# Patient Record
Sex: Male | Born: 1955 | Race: Black or African American | Hispanic: No | Marital: Married | Smoking: Current every day smoker
Health system: Southern US, Community
[De-identification: ages and names within clinical notes are randomized; demographics above are authoritative.]

## PROBLEM LIST (undated history)

## (undated) DIAGNOSIS — K649 Unspecified hemorrhoids: Secondary | ICD-10-CM

## (undated) DIAGNOSIS — I10 Essential (primary) hypertension: Secondary | ICD-10-CM

## (undated) HISTORY — PX: PANCREATECTOMY: SHX1019

## (undated) HISTORY — PX: KIDNEY DONATION: SHX685

---

## 2000-12-04 ENCOUNTER — Emergency Department (HOSPITAL_COMMUNITY): Admission: EM | Admit: 2000-12-04 | Discharge: 2000-12-05 | Payer: Self-pay | Admitting: Emergency Medicine

## 2001-05-07 ENCOUNTER — Observation Stay (HOSPITAL_COMMUNITY): Admission: EM | Admit: 2001-05-07 | Discharge: 2001-05-08 | Payer: Self-pay | Admitting: Emergency Medicine

## 2001-05-07 ENCOUNTER — Encounter: Payer: Self-pay | Admitting: Emergency Medicine

## 2001-05-07 ENCOUNTER — Encounter: Payer: Self-pay | Admitting: Family Medicine

## 2002-03-16 ENCOUNTER — Emergency Department (HOSPITAL_COMMUNITY): Admission: EM | Admit: 2002-03-16 | Discharge: 2002-03-16 | Payer: Self-pay

## 2002-05-10 ENCOUNTER — Emergency Department (HOSPITAL_COMMUNITY): Admission: EM | Admit: 2002-05-10 | Discharge: 2002-05-10 | Payer: Self-pay | Admitting: Emergency Medicine

## 2002-05-23 ENCOUNTER — Emergency Department (HOSPITAL_COMMUNITY): Admission: EM | Admit: 2002-05-23 | Discharge: 2002-05-24 | Payer: Self-pay | Admitting: Emergency Medicine

## 2002-05-23 ENCOUNTER — Encounter: Payer: Self-pay | Admitting: Emergency Medicine

## 2003-03-03 ENCOUNTER — Emergency Department (HOSPITAL_COMMUNITY): Admission: EM | Admit: 2003-03-03 | Discharge: 2003-03-04 | Payer: Self-pay | Admitting: Emergency Medicine

## 2004-06-16 ENCOUNTER — Emergency Department (HOSPITAL_COMMUNITY): Admission: EM | Admit: 2004-06-16 | Discharge: 2004-06-16 | Payer: Self-pay | Admitting: *Deleted

## 2005-03-20 ENCOUNTER — Emergency Department (HOSPITAL_COMMUNITY): Admission: EM | Admit: 2005-03-20 | Discharge: 2005-03-20 | Payer: Self-pay | Admitting: Emergency Medicine

## 2006-11-01 ENCOUNTER — Emergency Department (HOSPITAL_COMMUNITY): Admission: EM | Admit: 2006-11-01 | Discharge: 2006-11-01 | Payer: Self-pay | Admitting: Emergency Medicine

## 2008-03-08 ENCOUNTER — Emergency Department (HOSPITAL_COMMUNITY): Admission: EM | Admit: 2008-03-08 | Discharge: 2008-03-08 | Payer: Self-pay | Admitting: Emergency Medicine

## 2010-07-06 ENCOUNTER — Emergency Department (HOSPITAL_BASED_OUTPATIENT_CLINIC_OR_DEPARTMENT_OTHER): Admission: EM | Admit: 2010-07-06 | Discharge: 2010-07-06 | Payer: Self-pay | Admitting: Emergency Medicine

## 2010-07-06 ENCOUNTER — Ambulatory Visit: Payer: Self-pay | Admitting: Diagnostic Radiology

## 2010-11-03 ENCOUNTER — Emergency Department (HOSPITAL_COMMUNITY)
Admission: EM | Admit: 2010-11-03 | Discharge: 2010-11-03 | Payer: Self-pay | Source: Home / Self Care | Admitting: Emergency Medicine
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
## 2011-07-24 LAB — COMPREHENSIVE METABOLIC PANEL
ALT: 22
Albumin: 3.6
Calcium: 8.9
Glucose, Bld: 65 — ABNORMAL LOW
Sodium: 139
Total Protein: 7

## 2011-07-24 LAB — URINALYSIS, ROUTINE W REFLEX MICROSCOPIC
Bilirubin Urine: NEGATIVE
Ketones, ur: NEGATIVE
Nitrite: NEGATIVE
Protein, ur: NEGATIVE
Urobilinogen, UA: 0.2
pH: 6.5

## 2011-07-24 LAB — DIFFERENTIAL
Lymphs Abs: 2.2
Monocytes Absolute: 0.6
Monocytes Relative: 7
Neutro Abs: 6.1
Neutrophils Relative %: 67

## 2011-07-24 LAB — CBC
Hemoglobin: 13.8
MCHC: 33.6
Platelets: 227
RDW: 13

## 2012-07-04 ENCOUNTER — Encounter (HOSPITAL_COMMUNITY): Payer: Self-pay | Admitting: Emergency Medicine

## 2012-07-04 ENCOUNTER — Emergency Department (HOSPITAL_COMMUNITY)
Admission: EM | Admit: 2012-07-04 | Discharge: 2012-07-04 | Disposition: A | Discharge status: Home / Self Care | Payer: Self-pay | Attending: Emergency Medicine | Admitting: Emergency Medicine

## 2012-07-04 DIAGNOSIS — R45851 Suicidal ideations: Secondary | ICD-10-CM

## 2012-07-04 DIAGNOSIS — F32A Depression, unspecified: Secondary | ICD-10-CM

## 2012-07-04 DIAGNOSIS — R51 Headache: Secondary | ICD-10-CM | POA: Insufficient documentation

## 2012-07-04 DIAGNOSIS — F3289 Other specified depressive episodes: Secondary | ICD-10-CM | POA: Insufficient documentation

## 2012-07-04 DIAGNOSIS — F329 Major depressive disorder, single episode, unspecified: Secondary | ICD-10-CM | POA: Insufficient documentation

## 2012-07-04 LAB — COMPREHENSIVE METABOLIC PANEL
ALT: 18 U/L (ref 0–53)
AST: 20 U/L (ref 0–37)
Albumin: 3.7 g/dL (ref 3.5–5.2)
Alkaline Phosphatase: 67 U/L (ref 39–117)
BUN: 14 mg/dL (ref 6–23)
CO2: 27 mEq/L (ref 19–32)
Calcium: 9.2 mg/dL (ref 8.4–10.5)
Chloride: 101 mEq/L (ref 96–112)
Creatinine, Ser: 1.32 mg/dL (ref 0.50–1.35)
GFR calc Af Amer: 68 mL/min — ABNORMAL LOW (ref >90)
GFR calc non Af Amer: 58 mL/min — ABNORMAL LOW (ref >90)
Glucose, Bld: 120 mg/dL — ABNORMAL HIGH (ref 70–99)
Potassium: 3.7 mEq/L (ref 3.5–5.1)
Sodium: 138 mEq/L (ref 135–145)
Total Bilirubin: 0.5 mg/dL (ref 0.3–1.2)
Total Protein: 7.4 g/dL (ref 6.0–8.3)

## 2012-07-04 LAB — RAPID URINE DRUG SCREEN, HOSP PERFORMED
Amphetamines: NOT DETECTED
Barbiturates: NOT DETECTED
Benzodiazepines: NOT DETECTED
Cocaine: POSITIVE — AB
Opiates: NOT DETECTED
Tetrahydrocannabinol: NOT DETECTED

## 2012-07-04 LAB — URINALYSIS, ROUTINE W REFLEX MICROSCOPIC
Bilirubin Urine: NEGATIVE
Glucose, UA: NEGATIVE mg/dL
Hgb urine dipstick: NEGATIVE
Nitrite: NEGATIVE
Protein, ur: NEGATIVE mg/dL
Specific Gravity, Urine: 1.018 (ref 1.005–1.030)
Urobilinogen, UA: 1 mg/dL (ref 0.0–1.0)
pH: 6 (ref 5.0–8.0)

## 2012-07-04 LAB — CBC WITH DIFFERENTIAL/PLATELET
Basophils Absolute: 0.1 10*3/uL (ref 0.0–0.1)
Basophils Relative: 1 % (ref 0–1)
Eosinophils Absolute: 0.2 10*3/uL (ref 0.0–0.7)
Eosinophils Relative: 2 % (ref 0–5)
HCT: 42.3 % (ref 39.0–52.0)
Hemoglobin: 14.4 g/dL (ref 13.0–17.0)
Lymphocytes Relative: 19 % (ref 12–46)
Lymphs Abs: 1.8 10*3/uL (ref 0.7–4.0)
MCH: 29.6 pg (ref 26.0–34.0)
MCHC: 34 g/dL (ref 30.0–36.0)
MCV: 86.9 fL (ref 78.0–100.0)
Monocytes Absolute: 0.6 10*3/uL (ref 0.1–1.0)
Monocytes Relative: 7 % (ref 3–12)
Neutro Abs: 6.9 10*3/uL (ref 1.7–7.7)
Neutrophils Relative %: 72 % (ref 43–77)
Platelets: 276 10*3/uL (ref 150–400)
RBC: 4.87 MIL/uL (ref 4.22–5.81)
RDW: 13.2 % (ref 11.5–15.5)
WBC: 9.5 10*3/uL (ref 4.0–10.5)

## 2012-07-04 LAB — GLUCOSE, CAPILLARY: Glucose-Capillary: 151 mg/dL — ABNORMAL HIGH (ref 70–99)

## 2012-07-04 LAB — URINE MICROSCOPIC-ADD ON

## 2012-07-04 MED ORDER — METOCLOPRAMIDE HCL 5 MG/ML IJ SOLN
10.0000 mg | Freq: Once | INTRAMUSCULAR | Status: AC
  Administered 2012-07-04: 10 mg via INTRAVENOUS
  Filled 2012-07-04: qty 2

## 2012-07-04 MED ORDER — DIPHENHYDRAMINE HCL 50 MG/ML IJ SOLN
25.0000 mg | Freq: Once | INTRAMUSCULAR | Status: AC
  Administered 2012-07-04: 25 mg via INTRAVENOUS
  Filled 2012-07-04: qty 1

## 2012-07-04 MED ORDER — SODIUM CHLORIDE 0.9 % IV BOLUS (SEPSIS)
1000.0000 mL | Freq: Once | INTRAVENOUS | Status: AC
  Administered 2012-07-04: 1000 mL via INTRAVENOUS

## 2012-07-04 MED ORDER — KETOROLAC TROMETHAMINE 60 MG/2ML IM SOLN
60.0000 mg | Freq: Once | INTRAMUSCULAR | Status: AC
  Administered 2012-07-04: 60 mg via INTRAMUSCULAR
  Filled 2012-07-04: qty 2

## 2012-07-04 MED ORDER — OXYCODONE-ACETAMINOPHEN 5-325 MG PO TABS
2.0000 | ORAL_TABLET | Freq: Once | ORAL | Status: AC
  Administered 2012-07-04: 2 via ORAL
  Filled 2012-07-04: qty 2

## 2012-07-04 MED ORDER — CITALOPRAM HYDROBROMIDE 10 MG PO TABS: 10.0000 mg | ORAL_TABLET | Freq: Every day | ORAL | Status: DC

## 2012-07-04 MED ORDER — DEXTROSE 5 % IV SOLN
1.0000 g | Freq: Once | INTRAVENOUS | Status: AC
  Administered 2012-07-04: 1 g via INTRAVENOUS
  Filled 2012-07-04: qty 10

## 2012-07-04 NOTE — BH Assessment (Signed)
Assessment Note   Scott Tate is an 56 y.o. male who presents voluntarily to [facility] accompanied by girlfriend. Pt states his mood is "in between sand and happy". He endorses loss of appetite and weight loss. He endorses SI without intent and no plan. Pt has no hx of suicide attempts. He denies HI. Pt denies AH & VH. No delusions noted. His affect is depressed. Pt is future oriented and sts he is concerned about missing work. He has worked at [employer] for 27 yrs. Current stressor is work and his new boss who "nags" him and isn't as familiar with job responsibilities as pt is. Pt enodrses cocaine use (powder & crack cocaine) twice a week for past 2 mos after long period of sobriety. Pt sts he wishes he didn't start using cocaine again and just stuck to using alcohol. Pt denies any withdrawal symptoms.  Telepsych recommends d/c with followup to outpatient clinic. Writer provided pt with outpatient referrals. Pt indicates he will contact [facility] to set up appt. Pt also requesting note for work explaining his absence.   Axis I: Mood Disorder NOS Axis II: Deferred Axis III: History reviewed. No pertinent past medical history. Axis IV: occupational problems Axis V: 51-60 moderate symptoms  Past Medical History: History reviewed. No pertinent past medical history.  Past Surgical History  Procedure Date  . Kidney donation     [facility], L kidney     Family History: No family history on file.  Social History:  reports that he has been smoking.  He has never used smokeless tobacco. He reports that he drinks about 4.8 ounces of alcohol per week. He reports that he does not use illicit drugs.  Additional Social History:  Alcohol / Drug Use Pain Medications: n/a Prescriptions: n/a Over the Counter: n/a History of alcohol / drug use?: Yes Longest period of sobriety (when/how long): 2 yrs Substance #1 Name of Substance 1: cocaine - both powder and crack cocaine 1 - Age of First Use: early  30s 1 - Amount (size/oz): varies 1 - Frequency: once every 2wks 1 - Duration: relapsed 2 mos ago 1 - Last Use / Amount: 07/02/12 - 0.5 oz Substance #2 Name of Substance 2: alcohol 2 - Age of First Use: 14 2 - Amount (size/oz): varies 2 - Frequency: drank "heavily" during early 30s 2 - Duration: on and off since age 78 2 - Last Use / Amount: 06/27/12  3 12  oz beers and 1 pt liquor  CIWA: CIWA-Ar BP: 113/65 mmHg Pulse Rate: 56  COWS:    Allergies: No Known Allergies  Home Medications:  (Not in a hospital admission)  OB/GYN Status:  No LMP for male patient.  General Assessment Data Location of Assessment: [facility] Living Arrangements: Spouse/significant other Can pt return to current living arrangement?: Yes Admission Status: Voluntary Is patient capable of signing voluntary admission?: Yes Transfer from: Home Referral Source: Self/Family/Friend  Education Status Is patient currently in school?: No Current Grade: na Highest grade of school patient has completed: 12 Name of school: in [facility] [place] Contact person: na  Risk to self Suicidal Ideation: Yes-Currently Present Suicidal Intent: No Is patient at risk for suicide?: No Suicidal Plan?: No Access to Means: No What has been your use of drugs/alcohol within the last 12 months?: alcohol and powder & crack cocaine once every 2 wks Previous Attempts/Gestures: No How many times?: 0  Other Self Harm Risks: na Triggers for Past Attempts:  (na) Intentional Self Injurious Behavior: None  Family Suicide History: No Recent stressful life event(s): Other (Comment) (work stress, relapsed on cocaine 2 mos ago) Persecutory voices/beliefs?: No Depression: Yes Depression Symptoms:  (loss of appetite and weight loss without trying) Substance abuse history and/or treatment for substance abuse?: Yes Suicide prevention information given to non-admitted patients: Not applicable  Risk to Others Homicidal Ideation: No Thoughts of Harm to Others:  No Current Homicidal Intent: No Current Homicidal Plan: No Access to Homicidal Means: No Identified Victim: na History of harm to others?: No Assessment of Violence: None Noted Violent Behavior Description: pt calm and polite Does patient have access to weapons?: No Criminal Charges Pending?: No Does patient have a court date: No  Psychosis Hallucinations: None noted Delusions: None noted  Mental Status Report Appear/Hygiene: Other (Comment) (appropriate) Eye Contact: Fair Motor Activity: Freedom of movement Speech: Soft;Logical/coherent Level of Consciousness: Alert Mood: Depressed Affect: Appropriate to circumstance Anxiety Level: None Thought Processes: Coherent;Relevant Judgement: Unimpaired Orientation: Place;Person;Time;Situation Obsessive Compulsive Thoughts/Behaviors: None  Cognitive Functioning Concentration: Normal Memory: Recent Intact;Remote Intact IQ: Average Insight: Fair Impulse Control: Fair Appetite: Poor Weight Loss:  (clothes loose but hasnt weighed himself) Weight Gain: 0  Sleep: No Change Total Hours of Sleep: 7  Vegetative Symptoms: None  ADLScreening [facility] Assessment Services) Patient's cognitive ability adequate to safely complete daily activities?: Yes Patient able to express need for assistance with ADLs?: Yes Independently performs ADLs?: Yes (appropriate for developmental age)  Abuse/Neglect [facility]) Physical Abuse: Denies Verbal Abuse: Denies Sexual Abuse: Denies  Prior Inpatient Therapy Prior Inpatient Therapy: No Prior Therapy Dates: na Prior Therapy Facilty/Provider(s): na Reason for Treatment: na  Prior Outpatient Therapy Prior Outpatient Therapy: No Prior Therapy Dates: na Prior Therapy Facilty/Provider(s): na Reason for Treatment: na  ADL Screening (condition at time of admission) Patient's cognitive ability adequate to safely complete daily activities?: Yes Patient able to express need for assistance with ADLs?:  Yes Independently performs ADLs?: Yes (appropriate for developmental age) Weakness of Legs: None Weakness of Arms/Hands: None  Home Assistive Devices/Equipment Home Assistive Devices/Equipment: Eyeglasses    Abuse/Neglect Assessment (Assessment to be complete while patient is alone) Physical Abuse: Denies Verbal Abuse: Denies Sexual Abuse: Denies Exploitation of patient/patient's resources: Denies Self-Neglect: Denies Values / Beliefs Cultural Requests During Hospitalization: None Spiritual Requests During Hospitalization: None   Advance Directives (For Healthcare) Advance Directive: Patient does not have advance directive;Patient would not like information    Additional Information 1:1 In Past 12 Months?: No CIRT Risk: No Elopement Risk: No Does patient have medical clearance?: Yes     Disposition:  Disposition Disposition of Patient: Referred to Patient referred to: Other (Comment);Outpatient clinic referral (pt given outpt resource list)  On Site Evaluation by:   Reviewed with Physician:     Donnamarie Rossetti P 07/04/2012 10:09 PM

## 2012-07-04 NOTE — ED Notes (Addendum)
Telepsych complete. MD called back with recommendations, will fax to EDP.

## 2012-07-04 NOTE — ED Notes (Signed)
Pt presents w/ generalized malaise, inability to eat, nausea w/ emesis a couple of times. No energy. Has severe headache, today states cannot lift his head this morning.

## 2012-07-04 NOTE — ED Provider Notes (Addendum)
History    55yM with multiple complaints. General malaise. Just doesn't feel well. No energy. Feels achy. No appetite. Occasionally nauseated. Intermittent HA. When questioned further about depression, pt became tearful. Says he is. Says has had thoughts of self harm although he doesn't seem to want to discuss specifics. Hx of drug abuse and thoughts of using. No HI. No hallucinations.  CSN: [id number]  Arrival date & time 07/04/12  1221   First MD Initiated Contact with Patient 07/04/12 1501      Chief Complaint  Patient presents with  . Headache    (Consider location/radiation/quality/duration/timing/severity/associated sxs/prior treatment) HPI  History reviewed. No pertinent past medical history.  Past Surgical History  Procedure Date  . Kidney donation     [facility], L kidney     No family history on file.  History  Substance Use Topics  . Smoking status: Current Everyday Smoker -- 0.5 packs/day  . Smokeless tobacco: Never Used  . Alcohol Use: 4.8 oz/week    8 Cans of beer per week     on wkds      Review of Systems   Review of symptoms negative unless otherwise noted in HPI.   Allergies  Review of patient's allergies indicates no known allergies.  Home Medications  No current outpatient prescriptions on file.  BP 132/73  Pulse 58  Temp 98 F (36.7 C) (Oral)  Resp 16  SpO2 100%  Physical Exam  Nursing note and vitals reviewed. Constitutional: He appears well-developed and well-nourished. No distress.  HENT:  Head: Normocephalic and atraumatic.  Eyes: Conjunctivae are normal. Right eye exhibits no discharge. Left eye exhibits no discharge.  Neck: Neck supple.  Cardiovascular: Normal rate, regular rhythm and normal heart sounds.  Exam reveals no gallop and no friction rub.   No murmur heard. Pulmonary/Chest: Effort normal and breath sounds normal. No respiratory distress.  Abdominal: Soft. He exhibits no distension. There is no tenderness.    Musculoskeletal: He exhibits no edema and no tenderness.  Neurological: He is alert.  Skin: Skin is warm and dry.  Psychiatric:       Flat affect. Poor eye contact. Crying at times. Speech clear and content appropriate. Does not appear to be responding to internal stimuli.    ED Course  Procedures (including critical care time)  Labs Reviewed  GLUCOSE, CAPILLARY - Abnormal; Notable for the following:    Glucose-Capillary 151 (*)     All other components within normal limits  COMPREHENSIVE METABOLIC PANEL - Abnormal; Notable for the following:    Glucose, Bld 120 (*)     GFR calc non Af Amer 58 (*)     GFR calc Af Amer 68 (*)     All other components within normal limits  CBC WITH DIFFERENTIAL  URINALYSIS, ROUTINE W REFLEX MICROSCOPIC  URINE RAPID DRUG SCREEN (HOSP PERFORMED)   No results found.   1. Depression   2. Suicidal ideations       MDM  55yM with multiple vague complaints. Suspect that majority of complaints related to depression as opposed or organic etiology.  Consider emergent etiology of symptoms, particularly HA, but doubt. Plan symptomatic tx. Pt seems severely depressed. Feel that he would benefit from psychiatry consultation. Telepsych ordered. Discussed with [facility] to speak with pt in terms or drug counseling and to potentially facilitate placement if needed. Pt is voluntary.   Raeford Razor, MD 07/04/12 1634  9:14 PM Patient was evaluated by psychiatry. Feels he is safe for discharge  at this time. Is recommending citalopram daily. Outpatient treatment for substance abuse. Prescription and resources provided.  Raeford Razor, MD 07/04/12 2115

## 2012-07-04 NOTE — ED Notes (Signed)
Pt. Stated that he would attempt to void for specimen collection in 15 min

## 2012-07-04 NOTE — ED Notes (Signed)
Family at bedside. 

## 2012-07-06 LAB — URINE CULTURE
Colony Count: NO GROWTH
Culture: NO GROWTH

## 2013-06-23 ENCOUNTER — Encounter (HOSPITAL_COMMUNITY): Payer: Self-pay | Admitting: Emergency Medicine

## 2013-06-23 ENCOUNTER — Emergency Department (HOSPITAL_COMMUNITY)
Admission: EM | Admit: 2013-06-23 | Discharge: 2013-06-23 | Disposition: A | Discharge status: Home / Self Care | Payer: Self-pay | Attending: Emergency Medicine | Admitting: Emergency Medicine

## 2013-06-23 DIAGNOSIS — R519 Headache, unspecified: Secondary | ICD-10-CM

## 2013-06-23 DIAGNOSIS — R51 Headache: Secondary | ICD-10-CM | POA: Insufficient documentation

## 2013-06-23 DIAGNOSIS — F17200 Nicotine dependence, unspecified, uncomplicated: Secondary | ICD-10-CM | POA: Insufficient documentation

## 2013-06-23 MED ORDER — IBUPROFEN 600 MG PO TABS: 600.0000 mg | ORAL_TABLET | Freq: Four times a day (QID) | ORAL | Status: DC | PRN

## 2013-06-23 MED ORDER — METOCLOPRAMIDE HCL 5 MG/ML IJ SOLN
10.0000 mg | Freq: Once | INTRAMUSCULAR | Status: AC
  Administered 2013-06-23: 10 mg via INTRAVENOUS
  Filled 2013-06-23: qty 2

## 2013-06-23 MED ORDER — DIPHENHYDRAMINE HCL 50 MG/ML IJ SOLN
25.0000 mg | Freq: Once | INTRAMUSCULAR | Status: AC
  Administered 2013-06-23: 25 mg via INTRAVENOUS
  Filled 2013-06-23: qty 1

## 2013-06-23 MED ORDER — TETRACAINE HCL 0.5 % OP SOLN
1.0000 [drp] | Freq: Once | OPHTHALMIC | Status: AC
  Administered 2013-06-23: 1 [drp] via OPHTHALMIC
  Filled 2013-06-23: qty 2

## 2013-06-23 MED ORDER — HYDROCODONE-ACETAMINOPHEN 5-325 MG PO TABS: 1.0000 | ORAL_TABLET | Freq: Four times a day (QID) | ORAL | Status: DC | PRN

## 2013-06-23 MED ORDER — SODIUM CHLORIDE 0.9 % IV BOLUS (SEPSIS)
1000.0000 mL | Freq: Once | INTRAVENOUS | Status: AC
  Administered 2013-06-23: 1000 mL via INTRAVENOUS

## 2013-06-23 MED ORDER — KETOROLAC TROMETHAMINE 30 MG/ML IJ SOLN
30.0000 mg | Freq: Once | INTRAMUSCULAR | Status: AC
  Administered 2013-06-23: 30 mg via INTRAVENOUS
  Filled 2013-06-23: qty 1

## 2013-06-23 NOTE — ED Provider Notes (Signed)
CSN: [id number]     Arrival date & time 06/23/13  1517 History   First MD Initiated Contact with Patient 06/23/13 1609     Chief Complaint  Patient presents with  . Headache   (Consider location/radiation/quality/duration/timing/severity/associated sxs/prior Treatment) HPI Comments: SUBJECTIVE: Scott Tate is a 57 y.o. male who complains of headaches for 3 days. Headache is left sided, constant, throbbing, initially 5/10, now 8/10 - and he has taken no meds for it. Pt's headache is left sided, and around the eye. No photophobia, no visual compromise. No hx of similar headaches. Pt has no medical hx, no new meds and he denies nausea, vomiting, visual complains, seizures, altered mental status, loss of consciousness, new weakness, or numbness, no gait instability.   Patient is a 57 y.o. male presenting with headaches. The history is provided by the patient.  Headache Associated symptoms: no abdominal pain and no cough     History reviewed. No pertinent past medical history. Past Surgical History  Procedure Laterality Date  . Kidney donation      [facility], L kidney    History reviewed. No pertinent family history. History  Substance Use Topics  . Smoking status: Current Every Day Smoker -- 0.50 packs/day  . Smokeless tobacco: Never Used  . Alcohol Use: 4.8 oz/week    8 Cans of beer per week     Comment: on wkds    Review of Systems  Constitutional: Negative for activity change and appetite change.  Respiratory: Negative for cough and shortness of breath.   Cardiovascular: Negative for chest pain.  Gastrointestinal: Negative for abdominal pain.  Genitourinary: Negative for dysuria.  Neurological: Positive for headaches.    Allergies  Review of patient's allergies indicates no known allergies.  Home Medications   Current Outpatient Rx  Name  Route  Sig  Dispense  Refill  . HYDROcodone-acetaminophen (NORCO/VICODIN) 5-325 MG per tablet   Oral   Take 1 tablet by  mouth every 6 (six) hours as needed for pain.   6 tablet   0   . ibuprofen (ADVIL,MOTRIN) 600 MG tablet   Oral   Take 1 tablet (600 mg total) by mouth every 6 (six) hours as needed for pain.   30 tablet   0    Pulse 62  Temp(Src) 98.4 F (36.9 C) (Oral)  Resp 20  SpO2 99% Physical Exam  Nursing note and vitals reviewed. Constitutional: He is oriented to person, place, and time. He appears well-developed.  HENT:  Head: Normocephalic and atraumatic.  Temporal pain - left side  Eyes: Conjunctivae and EOM are normal. Pupils are equal, round, and reactive to light.  Neck: Normal range of motion. Neck supple.  Cardiovascular: Normal rate and regular rhythm.   Pulmonary/Chest: Effort normal and breath sounds normal.  Abdominal: Soft. Bowel sounds are normal. He exhibits no distension. There is no tenderness. There is no rebound and no guarding.  Musculoskeletal: He exhibits no edema and no tenderness.  Neurological: He is alert and oriented to person, place, and time. No cranial nerve deficit. Coordination normal.  Skin: Skin is warm.    ED Course  Procedures (including critical care time) Labs Review Labs Reviewed  SEDIMENTATION RATE   Imaging Review No results found.  MDM   1. Headache    DDX includes: Primary headaches - including migrainous headaches, cluster headaches, tension headaches. ICH Carotid dissection Cavernous sinus thrombosis Meningitis Encephalitis Sinusitis Tumor Vascular headaches AV malformation Brain aneurysm Muscular headaches Glaucoma Temporal arteritis.  A/P: Pt comes in with cc of headaches. No concerns for life threatening secondary headaches because this was a gradual onset headache that has slowly intensified x 3 days and there is no associated neuro deficits. Pt has some pain around the eye - and in the termoral region - so sed rate was ordered, and it;s negative.  i also checked eye pressures - and the left eye pressures x 2  came back as 7 and 8.   Pt was given headahe cocktail, his headaches improved considerably. Will d/c    Derwood Kaplan, MD 06/23/13 1939

## 2013-06-23 NOTE — ED Notes (Addendum)
Pt presents tot he ED with a complaint of a headache that is located on the left side of his head.  Pt states that the pain began " a couple of days ago."  Pt has no history of migraines.  Pt is able to use both sides of his extremities and can follow the nurses finger with his eyes.  Pt presents with no numbness in any extremities.  Pt states he was dizzy and was about to "fall out."

## 2014-04-26 ENCOUNTER — Emergency Department (HOSPITAL_COMMUNITY)
Admission: EM | Admit: 2014-04-26 | Discharge: 2014-04-27 | Disposition: A | Discharge status: Home / Self Care | Payer: Self-pay | Attending: Emergency Medicine | Admitting: Emergency Medicine

## 2014-04-26 ENCOUNTER — Encounter (HOSPITAL_COMMUNITY): Payer: Self-pay | Admitting: Emergency Medicine

## 2014-04-26 ENCOUNTER — Emergency Department (HOSPITAL_COMMUNITY): Payer: Self-pay

## 2014-04-26 DIAGNOSIS — K920 Hematemesis: Secondary | ICD-10-CM | POA: Insufficient documentation

## 2014-04-26 DIAGNOSIS — Z79899 Other long term (current) drug therapy: Secondary | ICD-10-CM | POA: Insufficient documentation

## 2014-04-26 DIAGNOSIS — F17200 Nicotine dependence, unspecified, uncomplicated: Secondary | ICD-10-CM | POA: Insufficient documentation

## 2014-04-26 DIAGNOSIS — R110 Nausea: Secondary | ICD-10-CM | POA: Insufficient documentation

## 2014-04-26 LAB — CBC WITH DIFFERENTIAL/PLATELET
BASOS ABS: 0.1 10*3/uL (ref 0.0–0.1)
BASOS PCT: 1 % (ref 0–1)
EOS ABS: 0.2 10*3/uL (ref 0.0–0.7)
Eosinophils Relative: 2 % (ref 0–5)
HCT: 40.3 % (ref 39.0–52.0)
HEMOGLOBIN: 14 g/dL (ref 13.0–17.0)
Lymphocytes Relative: 25 % (ref 12–46)
Lymphs Abs: 2.5 10*3/uL (ref 0.7–4.0)
MCH: 30 pg (ref 26.0–34.0)
MCHC: 34.7 g/dL (ref 30.0–36.0)
MCV: 86.3 fL (ref 78.0–100.0)
Monocytes Absolute: 0.6 10*3/uL (ref 0.1–1.0)
Monocytes Relative: 6 % (ref 3–12)
NEUTROS ABS: 6.5 10*3/uL (ref 1.7–7.7)
NEUTROS PCT: 66 % (ref 43–77)
PLATELETS: 273 10*3/uL (ref 150–400)
RBC: 4.67 MIL/uL (ref 4.22–5.81)
RDW: 13.4 % (ref 11.5–15.5)
WBC: 9.8 10*3/uL (ref 4.0–10.5)

## 2014-04-26 LAB — COMPREHENSIVE METABOLIC PANEL
ALBUMIN: 3.6 g/dL (ref 3.5–5.2)
ALK PHOS: 58 U/L (ref 39–117)
ALT: 20 U/L (ref 0–53)
AST: 22 U/L (ref 0–37)
BILIRUBIN TOTAL: 0.4 mg/dL (ref 0.3–1.2)
BUN: 14 mg/dL (ref 6–23)
CHLORIDE: 103 meq/L (ref 96–112)
CO2: 25 mEq/L (ref 19–32)
Calcium: 9.7 mg/dL (ref 8.4–10.5)
Creatinine, Ser: 1.18 mg/dL (ref 0.50–1.35)
GFR calc Af Amer: 76 mL/min — ABNORMAL LOW (ref >90)
GFR calc non Af Amer: 66 mL/min — ABNORMAL LOW (ref >90)
Glucose, Bld: 91 mg/dL (ref 70–99)
POTASSIUM: 4.2 meq/L (ref 3.7–5.3)
SODIUM: 140 meq/L (ref 137–147)
TOTAL PROTEIN: 7.3 g/dL (ref 6.0–8.3)

## 2014-04-26 LAB — LIPASE, BLOOD: LIPASE: 28 U/L (ref 11–59)

## 2014-04-26 MED ORDER — SODIUM CHLORIDE 0.9 % IV SOLN
1000.0000 mL | Freq: Once | INTRAVENOUS | Status: AC
  Administered 2014-04-26: 1000 mL via INTRAVENOUS

## 2014-04-26 MED ORDER — ONDANSETRON HCL 4 MG PO TABS: 4.0000 mg | ORAL_TABLET | Freq: Four times a day (QID) | ORAL | Status: DC

## 2014-04-26 MED ORDER — OMEPRAZOLE 20 MG PO CPDR: 20.0000 mg | DELAYED_RELEASE_CAPSULE | Freq: Every day | ORAL | Status: DC

## 2014-04-26 MED ORDER — SODIUM CHLORIDE 0.9 % IV SOLN: 1000.0000 mL | INTRAVENOUS | Status: DC

## 2014-04-26 MED ORDER — ONDANSETRON HCL 4 MG/2ML IJ SOLN
4.0000 mg | Freq: Once | INTRAMUSCULAR | Status: AC
  Administered 2014-04-26: 4 mg via INTRAVENOUS
  Filled 2014-04-26: qty 2

## 2014-04-26 NOTE — ED Notes (Signed)
Pt reports generalized abd pain with intermittent hematemesis x 3 days.  However, denies n/v/d at present.  Pt also reports intermittent R shoulder pain and numbness, denies injury.

## 2014-04-26 NOTE — Progress Notes (Signed)
  CARE MANAGEMENT ED NOTE 04/26/2014  Patient:  Scott Tate,Scott Tate   Account Number:  [id number]  Date Initiated:  04/26/2014  Documentation initiated by:  Radford PaxFERRERO,Nyomie Ehrlich  Subjective/Objective Assessment:   Paktient presents to Ed with generalized abdominal pain with hemataemisis.     Subjective/Objective Assessment Detail:     Action/Plan:   Action/Plan Detail:   Anticipated DC Date:       Status Recommendation to Physician:   Result of Recommendation:    Other ED Services  Consult Working Plan    DC Planning Services  Other  PCP issues    Choice offered to / List presented to:            Status of service:  Completed, signed off  ED Comments:   ED Comments Detail:  EDCM spoke to patient at bedside.  [place] or [place].  [facility].  [facility] informed [facility] that walk ins are welcome from 9am -1030am Mon-Thurs.  [facility] patient may establish care, receive assistace with medications, speak to a Manufacturing systems engineerfinacial counselor or Child psychotherapistsocial worker and enroll into the orange card program.  [facility] also provided patient with list of pcps who accept self pay patients, list of financial resources in the community such as local churches, [employer] representativesalvation army, urban ministries, list of discounted pharmacies, phone number to inquire about Medicaid and Affordable care Act for insurance, and dental assistance for patients without insurance.  Patient thankful for resources.  No further EDCM needs at this itme.

## 2014-04-26 NOTE — ED Provider Notes (Signed)
CSN: [id number]     Arrival date & time 04/26/14  2015 History   First MD Initiated Contact with Patient 04/26/14 2118     Chief Complaint  Patient presents with  . Abdominal Pain     The history is provided by the patient.    Pt has had diffuse abdominal pain for the last few days.  He also has had some intermittent nausea and vomiting.  He has noticed blood in his emesis.  Last episode was this morning.  Bright red blood.  No vomiting since.  No blood in his stool.  No dark stools.  Appetite has decreased.  No diarrhea or constipation.  No nsaid or asa use.  Also noticed some burning discomfort in his shoulder. No chest pain or shortness of breath.  No nosebleed or nasal congestion.  History reviewed. No pertinent past medical history. Past Surgical History  Procedure Laterality Date  . [facility]    No family history on file. History  Substance Use Topics  . Smoking status: Current Every Day Smoker -- 0.50 packs/day  . Smokeless tobacco: Never Used  . Alcohol Use: 4.8 oz/week    8 Cans of beer per week     Comment: on wkds    Review of Systems  All other systems reviewed and are negative.     Allergies  Review of patient's allergies indicates no known allergies.  Home Medications   Prior to Admission medications   Medication Sig Start Date End Date Taking? Authorizing Reace Breshears  omeprazole (PRILOSEC) 20 MG capsule Take 1 capsule (20 mg total) by mouth daily. 04/26/14   Linwood DibblesJon Knapp, MD  ondansetron (ZOFRAN) 4 MG tablet Take 1 tablet (4 mg total) by mouth every 6 (six) hours. 04/26/14   Linwood DibblesJon Knapp, MD   BP 118/68  Pulse 61  Temp(Src) 99 F (37.2 C) (Oral)  Resp 16  SpO2 99% Physical Exam  Nursing note and vitals reviewed. Constitutional: He appears well-developed and well-nourished. No distress.  HENT:  Head: Normocephalic and atraumatic.  Right Ear: External ear normal.  Left Ear: External ear normal.  Eyes: Conjunctivae  are normal. Right eye exhibits no discharge. Left eye exhibits no discharge. No scleral icterus.  Neck: Neck supple. No tracheal deviation present.  Cardiovascular: Normal rate, regular rhythm and intact distal pulses.   Pulmonary/Chest: Effort normal and breath sounds normal. No stridor. No respiratory distress. He has no wheezes. He has no rales.  Abdominal: Soft. Bowel sounds are normal. He exhibits no distension and no mass. There is tenderness in the epigastric area. There is no rebound, no guarding and no CVA tenderness. No hernia.  Musculoskeletal: He exhibits no edema.       Right shoulder: He exhibits tenderness. He exhibits normal range of motion, no bony tenderness, no swelling and no deformity.  Neurological: He is alert. He has normal strength. No cranial nerve deficit (no facial droop, extraocular movements intact, no slurred speech) or sensory deficit. He exhibits normal muscle tone. He displays no seizure activity. Coordination normal.  Skin: Skin is warm and dry. No rash noted.  Psychiatric: He has a normal mood and affect.    ED Course  Procedures (including critical care time) Labs Review Labs Reviewed  COMPREHENSIVE METABOLIC PANEL - Abnormal; Notable for the following:    GFR calc non Af Amer 66 (*)    GFR calc Af Amer 76 (*)    All other components within  normal limits  CBC WITH DIFFERENTIAL  LIPASE, BLOOD  URINALYSIS, ROUTINE W REFLEX MICROSCOPIC    Imaging Review Dg Shoulder Right  04/26/2014   CLINICAL DATA:  Right shoulder pain for 1 week.  EXAM: RIGHT SHOULDER - 2+ VIEW  COMPARISON:  07/06/2010  FINDINGS: There is no evidence of fracture or dislocation. There is no evidence of arthropathy or other focal bone abnormality. Soft tissues are unremarkable.  IMPRESSION: Negative.   Electronically Signed   By: Signa Kellaylor  Stroud M.D.   On: 04/26/2014 21:54     MDM   Final diagnoses:  Hematemesis with nausea    Normal labs. Abdominal exam benign.  Doubt serious gi  bleeding.  Will dc home on antacids.  Recc follow up with a GI doctor.  Discussed warning signs and precautions.   Linwood DibblesJon Knapp, MD 04/26/14 [phone]

## 2014-04-26 NOTE — Discharge Instructions (Signed)
Hematemesis °This condition is the vomiting of blood. °CAUSES  °This can happen if you have a peptic ulcer or an irritation of the throat, stomach, or small bowel. Vomiting over and over again or swallowing blood from a nosebleed, coughing or facial injury can also result in bloody vomit. Anti-inflammatory pain medicines are a common cause of this potentially dangerous condition. The most serious causes of vomiting blood include: °· Ulcers (a bacteria called H. pylori is common cause of ulcers). °· Clotting problems. °· Alcoholism. °· Cirrhosis. °TREATMENT  °Treatment depends on the cause and the severity of the bleeding. Small amounts of blood streaks in the vomit is not the same as vomiting large amounts of bloody or dark, coffee grounds-like material. Weakness, fainting, dehydration, anemia, and continued alcohol or drug use increase the risk. Examination may include blood, vomit, or stool tests. The presence of bloody or dark stool that tests positive for blood (Hemoccult) means the bleeding has been going on for some time. Endoscopy and imaging studies may be done. Emergency treatment may include: °· IV medicines or fluids. °· Blood transfusions. °· Surgery. °Hospital care is required for high risk patients or when IV fluids or blood is needed. Upper GI bleeding can cause shock and death if not controlled. °HOME CARE INSTRUCTIONS  °· Your treatment does not require hospital care at this time. °· Remain at rest until your condition improves. °· Drink clear liquids as tolerated. °· Avoid: °¨ Alcohol. °¨ Nicotine. °¨ Aspirin. °¨ Any other anti-inflammatory medicine (ibuprofen, naproxen, and many others). °· Medications to suppress stomach acid or vomiting may be needed. Take all your medicine as prescribed. °· Be sure to see your caregiver for follow-up as recommended. °SEEK IMMEDIATE MEDICAL CARE IF:  °· You have repeated vomiting, dehydration, fainting, or extreme weakness. °· You are vomiting large amounts of  bloody or dark material. °· You pass large, dark or bloody stools. °Document Released: 11/21/2004 Document Revised: 01/06/2012 Document Reviewed: 12/07/2008 °ExitCare® Patient Information ©2015 [employer]. This information is not intended to replace advice given to you by your health care provider. Make sure you discuss any questions you have with your health care provider. ° °

## 2015-01-14 ENCOUNTER — Encounter (HOSPITAL_COMMUNITY): Payer: Self-pay | Admitting: Emergency Medicine

## 2015-01-14 ENCOUNTER — Emergency Department (HOSPITAL_COMMUNITY)
Admission: EM | Admit: 2015-01-14 | Discharge: 2015-01-14 | Disposition: A | Discharge status: Home / Self Care | Payer: Self-pay | Attending: Emergency Medicine | Admitting: Emergency Medicine

## 2015-01-14 DIAGNOSIS — Z720 Tobacco use: Secondary | ICD-10-CM | POA: Insufficient documentation

## 2015-01-14 DIAGNOSIS — M545 Low back pain, unspecified: Secondary | ICD-10-CM

## 2015-01-14 DIAGNOSIS — Z79899 Other long term (current) drug therapy: Secondary | ICD-10-CM | POA: Insufficient documentation

## 2015-01-14 DIAGNOSIS — F1410 Cocaine abuse, uncomplicated: Secondary | ICD-10-CM | POA: Insufficient documentation

## 2015-01-14 DIAGNOSIS — R809 Proteinuria, unspecified: Secondary | ICD-10-CM | POA: Insufficient documentation

## 2015-01-14 LAB — CBC WITH DIFFERENTIAL/PLATELET
BASOS ABS: 0 10*3/uL (ref 0.0–0.1)
Basophils Relative: 0 % (ref 0–1)
EOS PCT: 2 % (ref 0–5)
Eosinophils Absolute: 0.2 10*3/uL (ref 0.0–0.7)
HEMATOCRIT: 40.4 % (ref 39.0–52.0)
HEMOGLOBIN: 13.5 g/dL (ref 13.0–17.0)
LYMPHS PCT: 18 % (ref 12–46)
Lymphs Abs: 1.9 10*3/uL (ref 0.7–4.0)
MCH: 29.2 pg (ref 26.0–34.0)
MCHC: 33.4 g/dL (ref 30.0–36.0)
MCV: 87.3 fL (ref 78.0–100.0)
MONOS PCT: 10 % (ref 3–12)
Monocytes Absolute: 1.1 10*3/uL — ABNORMAL HIGH (ref 0.1–1.0)
NEUTROS PCT: 70 % (ref 43–77)
Neutro Abs: 7.2 10*3/uL (ref 1.7–7.7)
Platelets: 247 10*3/uL (ref 150–400)
RBC: 4.63 MIL/uL (ref 4.22–5.81)
RDW: 13.3 % (ref 11.5–15.5)
WBC: 10.4 10*3/uL (ref 4.0–10.5)

## 2015-01-14 LAB — COMPREHENSIVE METABOLIC PANEL
ALK PHOS: 51 U/L (ref 39–117)
ALT: 22 U/L (ref 0–53)
AST: 23 U/L (ref 0–37)
Albumin: 3.7 g/dL (ref 3.5–5.2)
Anion gap: 7 (ref 5–15)
BUN: 10 mg/dL (ref 6–23)
CO2: 28 mmol/L (ref 19–32)
Calcium: 9.3 mg/dL (ref 8.4–10.5)
Chloride: 104 mmol/L (ref 96–112)
Creatinine, Ser: 1.34 mg/dL (ref 0.50–1.35)
GFR calc non Af Amer: 56 mL/min — ABNORMAL LOW (ref >90)
GFR, EST AFRICAN AMERICAN: 65 mL/min — AB (ref >90)
Glucose, Bld: 79 mg/dL (ref 70–99)
POTASSIUM: 3.9 mmol/L (ref 3.5–5.1)
SODIUM: 139 mmol/L (ref 135–145)
TOTAL PROTEIN: 6.8 g/dL (ref 6.0–8.3)
Total Bilirubin: 1.1 mg/dL (ref 0.3–1.2)

## 2015-01-14 LAB — URINALYSIS, ROUTINE W REFLEX MICROSCOPIC
BILIRUBIN URINE: NEGATIVE
Glucose, UA: NEGATIVE mg/dL
Hgb urine dipstick: NEGATIVE
Ketones, ur: NEGATIVE mg/dL
LEUKOCYTES UA: NEGATIVE
Nitrite: NEGATIVE
PROTEIN: 30 mg/dL — AB
Specific Gravity, Urine: 1.018 (ref 1.005–1.030)
UROBILINOGEN UA: 1 mg/dL (ref 0.0–1.0)
pH: 6 (ref 5.0–8.0)

## 2015-01-14 LAB — RAPID URINE DRUG SCREEN, HOSP PERFORMED
AMPHETAMINES: NOT DETECTED
BARBITURATES: NOT DETECTED
BENZODIAZEPINES: NOT DETECTED
COCAINE: POSITIVE — AB
Opiates: NOT DETECTED
TETRAHYDROCANNABINOL: NOT DETECTED

## 2015-01-14 LAB — URINE MICROSCOPIC-ADD ON

## 2015-01-14 LAB — ETHANOL: Alcohol, Ethyl (B): <5 mg/dL (ref 0–9)

## 2015-01-14 MED ORDER — OXYCODONE-ACETAMINOPHEN 5-325 MG PO TABS
2.0000 | ORAL_TABLET | Freq: Once | ORAL | Status: AC
  Administered 2015-01-14: 2 via ORAL
  Filled 2015-01-14: qty 2

## 2015-01-14 MED ORDER — TRAMADOL HCL 50 MG PO TABS: 50.0000 mg | ORAL_TABLET | Freq: Four times a day (QID) | ORAL | Status: DC | PRN

## 2015-01-14 NOTE — Progress Notes (Signed)
CSW went to patient's room to give SA resources.  Patient had already left, but had forgotten his phone charger and Discharge paperwork.  CSW left resources with his Discharge paperwork in case the patient returns for it.  [facility] Marykay Mccleod Macy MisLCSW,LCAS [place] ED CSW [phone]

## 2015-01-14 NOTE — ED Notes (Signed)
Pt reports L sided flank pain radiating around to abdomen. Admits to crack cocaine, marijuana, and ETOH use last night. Reports argument with ex-girlfriend and then decided to drink heavily and use drugs. 9/10 pain upon arrival to ED. Denies urinary symptoms. Last BM yesterday was normal.

## 2015-01-14 NOTE — Discharge Instructions (Signed)
°Emergency Department Resource Guide °1) Find a Doctor and Pay Out of Pocket °Although you won't have to find out who is covered by your insurance plan, it is a good idea to ask around and get recommendations. You will then need to call the office and see if the doctor you have chosen will accept you as a new patient and what types of options they offer for patients who are self-pay. Some doctors offer discounts or will set up payment plans for their patients who do not have insurance, but you will need to ask so you aren't surprised when you get to your appointment. ° °2) Contact Your Local Health Department °Not all health departments have doctors that can see patients for sick visits, but many do, so it is worth a call to see if yours does. If you don't know where your local health department is, you can check in your phone book. The CDC also has a tool to help you locate your state's health department, and many state websites also have listings of all of their local health departments. ° °3) Find a Walk-in Clinic °If your illness is not likely to be very severe or complicated, you may want to try a walk in clinic. These are popping up all over the country in pharmacies, drugstores, and shopping centers. They're usually staffed by nurse practitioners or physician assistants that have been trained to treat common illnesses and complaints. They're usually fairly quick and inexpensive. However, if you have serious medical issues or chronic medical problems, these are probably not your best option. ° °No Primary Care Doctor: °- Call [facility] at  832-8000 - they can help you locate a primary care doctor that  accepts your insurance, provides certain services, etc. °- Physician Referral Service- 1-800-533-3463 ° °Chronic Pain Problems: °Organization         Address  Phone   Notes  °[facility]  (336) 297-2271 Patients need to be referred by their primary care doctor.  ° °Medication  Assistance: °Organization         Address  Phone   Notes  °[place] [facility] [address] °[place], [place] [address] (336) 641-8030 --Must be a resident of [place] °-- Must have NO insurance coverage whatsoever (no Medicaid/ Medicare, etc.) °-- The pt. MUST have a primary care doctor that directs their care regularly and follows them in the community °  °[facility]  (866) 331-1348   °[employer]  (888) 892-1162   ° °Agencies that provide inexpensive medical care: °Organization         Address  Phone   Notes  °[facility]  (336) 832-8035   °[facility]    (336) 832-7272   °[facility] [address] °[place], [place] [address] (336) 832-4777   °[facility] [address], °[place] (336) 271-4999   °[facility]    (336) 373-0678   °[facility]    (336) 272-1050   °[facility] ° [address], [place] Phone:  (336) 832-4444, Fax:  (336) 832-4440 Hours of Operation:  9 am - 6 pm, M-F.  Also accepts Medicaid/Medicare and self-pay.  °[facility] ° [address],  Phone: (336) 832-3150, Fax: (336) 832-3151. Hours of Operation:  8:30 am - 5:30 pm, M-F.  Also accepts Medicaid and self-pay.  °[facility] [place] [address], [place] Phone: (336) 878-6027   °[facility] [address], [place], [place] (336)723-1848, Ext. 123 Mondays & Thursdays: 7-9 AM.  First 15 patients are seen on a first come, first serve basis. °  ° °Medicaid-accepting [place] Providers: ° °Organization         Address  Phone   Notes  °[facility] [address], [place] (336) 641-2100 Also accepts self-pay patients.  °[facility] [address], [place] ° (336) 856-9996   °[facility] [address], [place]  (336) 288-8857   °[facility] [address] [place] [address], [place] (336) 299-7000   °Veita Bland [address], [place]  ° (336) 373-1557 Only accepts [place] Access Medicaid patients after they have their name applied to their card.  ° °Self-Pay (no insurance) in [place]: ° °Organization         Address  Phone   Notes  °Sickle Cell Patients, [facility] [address], [place] (336) 832-1970   °[facility] [address], [place] (336) 832-4400   °[facility] [place] ° [address],  (336) 992-4800   °[facility]/Dr. Osei-Bonsu ° [address] [place] [address], [place] or [address], [place] (336) 841-8500 Phone number for both [place] and [place] locations is the same.  °[facility] [address], [place] (336) 299-0000   °[facility] [address] [place] [address], [place] or [address] (336) 852-7530 °(336) 878-2260   °[facility] [address], [place] (336) 350-1642, phone; (336) 294-5005, fax Sees patients 1st and 3rd Saturday of every month.  Must not qualify for public or private insurance (i.e. Medicaid, Medicare, [place] Health Choice, Veterans' Benefits) • Household income should be no more than 200% of the poverty level •The clinic cannot treat you if you are pregnant or think you are pregnant • Sexually transmitted diseases are not treated at the clinic.  ° ° °Dental Care: °Organization         Address  Phone  Notes  °[place] [facility] [address], [place] (336) 641-6152 Accepts children up to age 21 who are enrolled in Medicaid or [place] Health Choice; pregnant women with a Medicaid card; and children who have applied for Medicaid or [place] Health Choice, but were declined, whose parents can pay a reduced fee at time of service.  °[place]  [facility] [place]  [address], [place] (336) 641-7733 Accepts children up to age 21 who are enrolled in Medicaid or [place] Health Choice; pregnant women with a Medicaid card; and children who have applied for Medicaid or [place] Health Choice, but were declined, whose parents can pay a reduced fee at time of service.  °[facility] ° [address], [place] (336) 641-4533 Patients are seen by appointment only. Walk-ins are not accepted. [facility] will see patients 18 years of age and older. °Monday - Tuesday (8am-5pm) °Most Wednesdays (8:30-5pm) °$30 per visit, cash only  °[facility] ° [address], [place] (336) 641-4533 Patients are seen by appointment only. Walk-ins are not accepted. [facility] will see patients 18 years of age and older. °One   Wednesday Evening (Monthly: Volunteer Based).  $30 per visit, cash only  °[facility]  (919) 537-3737 for adults; Children under age 4, call Graduate Pediatric Dentistry at ([phone]) [phone]. Children aged 4-14, please call (919) [phone] to request a pediatric application. ° Dental services are provided in all areas of dental care including fillings, crowns and bridges, complete and partial dentures, implants, gum treatment, root canals, and extractions. Preventive care is also provided. Treatment is provided to both adults and children. °Patients are selected via a lottery and there is often a waiting list. °  °[facility] [address], °[place] ° (336) 763-8833 [email or web address] °  °[facility] [address], [place], [place] (336)723-1848, Ext. 123 Second and Fourth Thursday of each month, opens at 6:30 AM; Clinic ends at 9 AM.  Patients are seen on a first-come first-served basis, and a limited number are seen during each clinic.  ° °[facility] ° [address], [place], [place] (336) 723-7904    Eligibility Requirements °You must have lived in [place], or [place] counties for at least the last three months. °  You cannot be eligible for state or federal sponsored healthcare insurance, including Veterans Administration, Medicaid, or Medicare. °  You generally cannot be eligible for healthcare insurance through your employer.  °  How to apply: °Eligibility screenings are held every Tuesday and Wednesday afternoon from 1:00 pm until 4:00 pm. You do not need an appointment for the interview!  °[facility] [address], [place], [place] 336-631-2330   °[facility]  336-342-8273   °[facility]  336-703-3100   °[facility]  336-570-6415   ° °Behavioral Health Resources in the Community: °Intensive Outpatient Programs °Organization         Address  Phone  Notes  °[facility] [address], [place], [place] 336-878-6098   °[facility] [address], [place], [place] 336-832-9800   °[facility] [address], [place], [place] ° 336-882-2125   °[facility] [address],  °[place], [place] 1-800-853-5163 or 336-641-4981   °Substance Abuse Resources °Organization         Address  Phone  Notes  °[facility]  336-882-2125   °[facility]  336-784-9470   °[facility]  336-285-9073   °[facility]  336-845-3988   °Residential & Outpatient Substance Abuse Program  1-800-659-3381   °Psychological Services °Organization         Address  Phone  Notes  °[facility]  336- 832-9600   °[facility]  336- 378-7881   °[facility] [address], [place] 1-800-853-5163 or 336-641-4981   ° °Mobile Crisis Teams °Organization         Address  Phone  Notes  °[facility], Mobile Crisis Care Unit  1-877-626-1772   °Assertive °[facility] ° [address]  [place], [place] 336-834-9664   °Sharon DeEsch [address] °[place] 336-554-5454   ° °Self-Help/Support Groups °Organization         Address  Phone             Notes  °[facility] - variety of support groups  336- 373-1402 Call for more information  °Narcotics Anonymous (NA), [facility] [address], °[place]  2 meetings at this location  ° °  Residential Treatment Programs °Organization         Address  Phone  Notes  °[facility] [address],    °[place]  1-866-801-8205   °[facility] ° [address], [place], [place] 704-293-8524   °[facility] [address], [place] 336-845-3988 Admissions: 8am-3pm M-F  °[facility] [address]    °[place], [place] 336-841-1104   °[facility] [address], [place], [place] 336-379-7146   °[facility] [address]  °[place], [place] 336-285-9073   °[facility] - Intensive Outpatient [address], [place], [place] 336-852-3033   °[facility] [address]  °[place], [place] 1-877-615-2722 or 336-784-9470   °[facility] [address] [place], [place] 336-227-7417 Accepts Medicaid  °[facility] [address]  °[place] 1-800-659-3381 Substance Abuse/Addiction Treatment  ° °[place] Behavioral Health Resources °Organization         Address  Phone  Notes  °[facility]  (888) 581-9988   °Julie Brannon, PhD [address] [place], [place]   (336) 349-5553 or (336) 951-0000   °[facility]   [address] °[place], [place] (336) 349-4454   °[facility] [address], [place], [place] (336) 342-8316 Insurance/Medicaid/sponsorship through [facility]  °[facility] [address]                                    [place], [place] (336) 342-8316 Therapy/tele-psych/case    °[facility] [address]  ° [place], [place] (336) 349-2233    °Dr. Arfeen  (336) 349-4544   °[facility] [place]  [employer] [place] [facility] 1) [address], [place] °2) [address], [place] °3)  [address], [place] (336) 349-3220 °(336) 342-7768 ° °(336) 342-8140   °[place] Child Abuse Hotline (336) 342-1394 or (336) 342-3537 (After Hours)    ° ° °

## 2015-01-14 NOTE — ED Provider Notes (Signed)
CSN: [id number]     Arrival date & time 01/14/15  1035 History   First MD Initiated Contact with Patient 01/14/15 1221     Chief Complaint  Patient presents with  . Abdominal Pain    left side pain  . Addiction Problem     (Consider location/radiation/quality/duration/timing/severity/associated sxs/prior Treatment) HPI Comments: Patient presents today with a chief complaint of left lower back pain.  He reports that the pain has been constant since yesterday.  Pain does not radiate.  Pain worse with movement.  He has not taken anything for his pain prior to arrival.  He denies any acute injury or trauma.  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  He denies urinary symptoms.  Denies fever or chills.  Denies abdominal pain.  No nausea, vomiting, or diarrhea.  Denies numbness or tingling.  Denies bowel or bladder incontinence.  Chief complaint is also listed as addiction problem.  He reports that he drinks alcohol 1-2 times a month.  He reports drinking alcohol heavily last evening.  Denies any history of withdrawal seizures or DT's.  He is not wanting alcohol detox at this time.  He also reports occasional Cocaine use with last use being one week ago.  He denies SI or HI.  Patient is a 59 y.o. male presenting with abdominal pain. The history is provided by the patient.  Abdominal Pain   History reviewed. No pertinent past medical history. Past Surgical History  Procedure Laterality Date  . Kidney donation      [facility], L kidney    History reviewed. No pertinent family history. History  Substance Use Topics  . Smoking status: Current Every Day Smoker -- 0.50 packs/day  . Smokeless tobacco: Never Used  . Alcohol Use: 4.8 oz/week    8 Cans of beer per week     Comment: on wkds    Review of Systems  Gastrointestinal: Positive for abdominal pain.  All other systems reviewed and are negative.     Allergies  Review of patient's allergies  indicates no known allergies.  Home Medications   Prior to Admission medications   Medication Sig Start Date End Date Taking? Authorizing Provider  omeprazole (PRILOSEC) 20 MG capsule Take 1 capsule (20 mg total) by mouth daily. 04/26/14   Linwood Dibbles, MD  ondansetron (ZOFRAN) 4 MG tablet Take 1 tablet (4 mg total) by mouth every 6 (six) hours. 04/26/14   Linwood Dibbles, MD   BP 146/72 mmHg  Pulse 70  Temp(Src) 98.5 F (36.9 C) (Oral)  Resp 18  Ht  (1.651 m)  Wt 155 lb (70.308 kg)  BMI 25.79 kg/m2  SpO2 98% Physical Exam  Constitutional: He appears well-developed and well-nourished.  HENT:  Head: Normocephalic and atraumatic.  Mouth/Throat: Oropharynx is clear and moist.  Neck: Normal range of motion. Neck supple.  Cardiovascular: Normal rate, regular rhythm and normal heart sounds.   Pulmonary/Chest: Effort normal and breath sounds normal.  Abdominal: Soft. Bowel sounds are normal. He exhibits no distension and no mass. There is no tenderness. There is no rebound and no guarding.  Musculoskeletal: Normal range of motion.       Lumbar back: He exhibits no tenderness and no bony tenderness.  Tenderness to palpation of the left lower back.  Pain worse with movement.  Neurological: He is alert. He has normal strength. No sensory deficit. Gait normal.  Skin: Skin is warm and dry.  Psychiatric: He  has a normal mood and affect.  Nursing note and vitals reviewed.   ED Course  Procedures (including critical care time) Labs Review Labs Reviewed  CBC WITH DIFFERENTIAL/PLATELET - Abnormal; Notable for the following:    Monocytes Absolute 1.1 (*)    All other components within normal limits  COMPREHENSIVE METABOLIC PANEL - Abnormal; Notable for the following:    GFR calc non Af Amer 56 (*)    GFR calc Af Amer 65 (*)    All other components within normal limits  ETHANOL  URINALYSIS, ROUTINE W REFLEX MICROSCOPIC  URINE RAPID DRUG SCREEN (HOSP PERFORMED)    Imaging Review No results  found.   EKG Interpretation None     2:45 PM Reassessed patient.  He reports that his pain is controlled at this time. MDM   Final diagnoses:  None   Patient presents today with pain of his left lower back.  Pain is worse with movement.  Labs unremarkable.  UA is negative.  Suspect that the pain is musculoskeletal.  Pain improved in the ED.  No spinal tenderness.   No neurological deficits and normal neuro exam.  Patient ambulating without difficulty.    No loss of bowel or bladder control.  No concern for cauda equina.  No fever, night sweats, weight loss, h/o cancer.  RICE protocol and pain medicine indicated and discussed with patient.  Patient stable for discharge.  Return precautions given.       Santiago GladHeather Madolyn Ackroyd, PA-C 01/16/15 2047  Arby BarretteMarcy Pfeiffer, MD 01/18/15 2130

## 2015-01-14 NOTE — ED Notes (Signed)
Pt. Stated, My left side hurts , and its on the side were i donated my kidney to my sister.  I also need to be rehab for alcohol and drugs.

## 2015-01-14 NOTE — ED Notes (Signed)
Pt resting quietly at the time. Vital signs stable. 8/10 L sided flank pain radiating to abdomen. Attempting to void at present.

## 2015-02-03 ENCOUNTER — Emergency Department (HOSPITAL_COMMUNITY)
Admission: EM | Admit: 2015-02-03 | Discharge: 2015-02-04 | Disposition: A | Discharge status: Home / Self Care | Payer: Self-pay | Attending: Emergency Medicine | Admitting: Emergency Medicine

## 2015-02-03 ENCOUNTER — Encounter (HOSPITAL_COMMUNITY): Payer: Self-pay | Admitting: Emergency Medicine

## 2015-02-03 DIAGNOSIS — K920 Hematemesis: Secondary | ICD-10-CM | POA: Insufficient documentation

## 2015-02-03 DIAGNOSIS — Z720 Tobacco use: Secondary | ICD-10-CM | POA: Insufficient documentation

## 2015-02-03 LAB — CBC
HCT: 42.7 % (ref 39.0–52.0)
Hemoglobin: 14.1 g/dL (ref 13.0–17.0)
MCH: 29.1 pg (ref 26.0–34.0)
MCHC: 33 g/dL (ref 30.0–36.0)
MCV: 88.2 fL (ref 78.0–100.0)
PLATELETS: 265 10*3/uL (ref 150–400)
RBC: 4.84 MIL/uL (ref 4.22–5.81)
RDW: 13.3 % (ref 11.5–15.5)
WBC: 10.5 10*3/uL (ref 4.0–10.5)

## 2015-02-03 NOTE — ED Notes (Signed)
Orthostatic vital signs not completed, accidentally clicked off by this Clinical research associatewriter.

## 2015-02-03 NOTE — ED Notes (Signed)
Patient states he has been vomiting blood x3 days, states today he also noticed blood in his stool. Also c/o left sided abd pain. Patient only has one kidney.

## 2015-02-04 LAB — COMPREHENSIVE METABOLIC PANEL
ALBUMIN: 4 g/dL (ref 3.5–5.2)
ALT: 20 U/L (ref 0–53)
ANION GAP: 8 (ref 5–15)
AST: 19 U/L (ref 0–37)
Alkaline Phosphatase: 51 U/L (ref 39–117)
BILIRUBIN TOTAL: 0.5 mg/dL (ref 0.3–1.2)
BUN: 16 mg/dL (ref 6–23)
CHLORIDE: 104 mmol/L (ref 96–112)
CO2: 27 mmol/L (ref 19–32)
CREATININE: 1.23 mg/dL (ref 0.50–1.35)
Calcium: 9.6 mg/dL (ref 8.4–10.5)
GFR calc Af Amer: 72 mL/min — ABNORMAL LOW (ref >90)
GFR calc non Af Amer: 62 mL/min — ABNORMAL LOW (ref >90)
Glucose, Bld: 114 mg/dL — ABNORMAL HIGH (ref 70–99)
POTASSIUM: 4.7 mmol/L (ref 3.5–5.1)
Sodium: 139 mmol/L (ref 135–145)
TOTAL PROTEIN: 7.5 g/dL (ref 6.0–8.3)

## 2015-02-04 LAB — ABO/RH: ABO/RH(D): O POS

## 2015-02-04 LAB — URINALYSIS, ROUTINE W REFLEX MICROSCOPIC
Bilirubin Urine: NEGATIVE
GLUCOSE, UA: NEGATIVE mg/dL
Hgb urine dipstick: NEGATIVE
KETONES UR: NEGATIVE mg/dL
Leukocytes, UA: NEGATIVE
NITRITE: NEGATIVE
Protein, ur: NEGATIVE mg/dL
SPECIFIC GRAVITY, URINE: 1.015 (ref 1.005–1.030)
Urobilinogen, UA: 0.2 mg/dL (ref 0.0–1.0)
pH: 6 (ref 5.0–8.0)

## 2015-02-04 LAB — TYPE AND SCREEN
ABO/RH(D): O POS
ANTIBODY SCREEN: NEGATIVE

## 2015-02-04 LAB — POC OCCULT BLOOD, ED: FECAL OCCULT BLD: NEGATIVE

## 2015-02-04 LAB — LIPASE, BLOOD: Lipase: 28 U/L (ref 11–59)

## 2015-02-04 LAB — ETHANOL: Alcohol, Ethyl (B): <5 mg/dL (ref 0–9)

## 2015-02-04 MED ORDER — PANTOPRAZOLE SODIUM 20 MG PO TBEC: 40.0000 mg | DELAYED_RELEASE_TABLET | Freq: Every day | ORAL | Status: DC

## 2015-02-04 MED ORDER — PANTOPRAZOLE SODIUM 40 MG IV SOLR
40.0000 mg | Freq: Once | INTRAVENOUS | Status: AC
  Administered 2015-02-04: 40 mg via INTRAVENOUS
  Filled 2015-02-04: qty 40

## 2015-02-04 MED ORDER — ONDANSETRON HCL 4 MG/2ML IJ SOLN
4.0000 mg | Freq: Once | INTRAMUSCULAR | Status: AC
  Administered 2015-02-04: 4 mg via INTRAVENOUS
  Filled 2015-02-04: qty 2

## 2015-02-04 MED ORDER — FENTANYL CITRATE 0.05 MG/ML IJ SOLN
50.0000 ug | Freq: Once | INTRAMUSCULAR | Status: AC
  Administered 2015-02-04: 50 ug via INTRAVENOUS
  Filled 2015-02-04: qty 2

## 2015-02-04 MED ORDER — ONDANSETRON 8 MG PO TBDP: 8.0000 mg | ORAL_TABLET | Freq: Three times a day (TID) | ORAL | Status: DC | PRN

## 2015-02-04 MED ORDER — SUCRALFATE 1 G PO TABS: 1.0000 g | ORAL_TABLET | Freq: Four times a day (QID) | ORAL | Status: DC

## 2015-02-04 MED ORDER — GI COCKTAIL ~~LOC~~
30.0000 mL | Freq: Once | ORAL | Status: AC
  Administered 2015-02-04: 30 mL via ORAL
  Filled 2015-02-04: qty 30

## 2015-02-04 NOTE — Discharge Instructions (Signed)
Take medications as prescribed.  It is important for you to follow-up with a primary care doctor, as well as a gastroenterologist for further workup and treatment of your symptoms.  Avoid alcohol use.  Try to cut back or quit smoking.  These will help heal your stomach and gastrointestinal tract.  Avoid use of aspirin, Advil, ibuprofen, Motrin, as this will irritate your stomach further.  Return to the emergency department for worsening condition or new concerning symptoms.    Gastrointestinal Bleeding Gastrointestinal (GI) bleeding means there is bleeding somewhere along the digestive tract, between the mouth and anus. CAUSES  There are many different problems that can cause GI bleeding. Possible causes include:  Esophagitis. This is inflammation, irritation, or swelling of the esophagus.  Hemorrhoids.These are veins that are full of blood (engorged) in the rectum. They cause pain, inflammation, and may bleed.  Anal fissures.These are areas of painful tearing which may bleed. They are often caused by passing hard stool.  Diverticulosis.These are pouches that form on the colon over time, with age, and may bleed significantly.  Diverticulitis.This is inflammation in areas with diverticulosis. It can cause pain, fever, and bloody stools, although bleeding is rare.  Polyps and cancer. Colon cancer often starts out as precancerous polyps.  Gastritis and ulcers.Bleeding from the upper gastrointestinal tract (near the stomach) may travel through the intestines and produce black, sometimes tarry, often bad smelling stools. In certain cases, if the bleeding is fast enough, the stools may not be black, but red. This condition may be life-threatening. SYMPTOMS   Vomiting bright red blood or material that looks like coffee grounds.  Bloody, black, or tarry stools. DIAGNOSIS  Your caregiver may diagnose your condition by taking your history and performing a physical exam. More tests may be  needed, including:  X-rays and other imaging tests.  Esophagogastroduodenoscopy (EGD). This test uses a flexible, lighted tube to look at your esophagus, stomach, and small intestine.  Colonoscopy. This test uses a flexible, lighted tube to look at your colon. TREATMENT  Treatment depends on the cause of your bleeding.   For bleeding from the esophagus, stomach, small intestine, or colon, the caregiver doing your EGD or colonoscopy may be able to stop the bleeding as part of the procedure.  Inflammation or infection of the colon can be treated with medicines.  Many rectal problems can be treated with creams, suppositories, or warm baths.  Surgery is sometimes needed.  Blood transfusions are sometimes needed if you have lost a lot of blood. If bleeding is slow, you may be allowed to go home. If there is a lot of bleeding, you will need to stay in the hospital for observation. HOME CARE INSTRUCTIONS   Take any medicines exactly as prescribed.  Keep your stools soft by eating foods that are high in fiber. These foods include whole grains, legumes, fruits, and vegetables. Prunes (1 to 3 a day) work well for many people.  Drink enough fluids to keep your urine clear or pale yellow. SEEK IMMEDIATE MEDICAL CARE IF:   Your bleeding increases.  You feel lightheaded, weak, or you faint.  You have severe cramps in your back or abdomen.  You pass large blood clots in your stool.  Your problems are getting worse. MAKE SURE YOU:   Understand these instructions.  Will watch your condition.  Will get help right away if you are not doing well or get worse. Document Released: 10/11/2000 Document Revised: 09/30/2012 Document Reviewed: 09/23/2011 [employer] Patient Information 2015  [employer]. This information is not intended to replace advice given to you by your health care provider. Make sure you discuss any questions you have with your health care provider.   Hematemesis This  condition is the vomiting of blood. CAUSES  This can happen if you have a peptic ulcer or an irritation of the throat, stomach, or small bowel. Vomiting over and over again or swallowing blood from a nosebleed, coughing or facial injury can also result in bloody vomit. Anti-inflammatory pain medicines are a common cause of this potentially dangerous condition. The most serious causes of vomiting blood include:  Ulcers (a bacteria called H. pylori is common cause of ulcers).  Clotting problems.  Alcoholism.  Cirrhosis. TREATMENT  Treatment depends on the cause and the severity of the bleeding. Small amounts of blood streaks in the vomit is not the same as vomiting large amounts of bloody or dark, coffee grounds-like material. Weakness, fainting, dehydration, anemia, and continued alcohol or drug use increase the risk. Examination may include blood, vomit, or stool tests. The presence of bloody or dark stool that tests positive for blood (Hemoccult) means the bleeding has been going on for some time. Endoscopy and imaging studies may be done. Emergency treatment may include:  IV medicines or fluids.  Blood transfusions.  Surgery. Hospital care is required for high risk patients or when IV fluids or blood is needed. Upper GI bleeding can cause shock and death if not controlled. HOME CARE INSTRUCTIONS   Your treatment does not require hospital care at this time.  Remain at rest until your condition improves.  Drink clear liquids as tolerated.  Avoid:  Alcohol.  Nicotine.  Aspirin.  Any other anti-inflammatory medicine (ibuprofen, naproxen, and many others).  Medications to suppress stomach acid or vomiting may be needed. Take all your medicine as prescribed.  Be sure to see your caregiver for follow-up as recommended. SEEK IMMEDIATE MEDICAL CARE IF:   You have repeated vomiting, dehydration, fainting, or extreme weakness.  You are vomiting large amounts of bloody or dark  material.  You pass large, dark or bloody stools. Document Released: 11/21/2004 Document Revised: 01/06/2012 Document Reviewed: 12/07/2008 [facility] Patient Information 2015 [place] [place]. This information is not intended to replace advice given to you by your health care provider. Make sure you discuss any questions you have with your health care provider.  Nausea and Vomiting Nausea is a sick feeling that often comes before throwing up (vomiting). Vomiting is a reflex where stomach contents come out of your mouth. Vomiting can cause severe loss of body fluids (dehydration). Children and elderly adults can become dehydrated quickly, especially if they also have diarrhea. Nausea and vomiting are symptoms of a condition or disease. It is important to find the cause of your symptoms. CAUSES   Direct irritation of the stomach lining. This irritation can result from increased acid production (gastroesophageal reflux disease), infection, food poisoning, taking certain medicines (such as nonsteroidal anti-inflammatory drugs), alcohol use, or tobacco use.  Signals from the brain.These signals could be caused by a headache, heat exposure, an inner ear disturbance, increased pressure in the brain from injury, infection, a tumor, or a concussion, pain, emotional stimulus, or metabolic problems.  An obstruction in the gastrointestinal tract (bowel obstruction).  Illnesses such as diabetes, hepatitis, gallbladder problems, appendicitis, kidney problems, cancer, sepsis, atypical symptoms of a heart attack, or eating disorders.  Medical treatments such as chemotherapy and radiation.  Receiving medicine that makes you sleep (general anesthetic) during surgery.  DIAGNOSIS Your caregiver may ask for tests to be done if the problems do not improve after a few days. Tests may also be done if symptoms are severe or if the reason for the nausea and vomiting is not clear. Tests may include:  Urine tests.  Blood  tests.  Stool tests.  Cultures (to look for evidence of infection).  X-rays or other imaging studies. Test results can help your caregiver make decisions about treatment or the need for additional tests. TREATMENT You need to stay well hydrated. Drink frequently but in small amounts.You may wish to drink water, sports drinks, clear broth, or eat frozen ice pops or gelatin dessert to help stay hydrated.When you eat, eating slowly may help prevent nausea.There are also some antinausea medicines that may help prevent nausea. HOME CARE INSTRUCTIONS   Take all medicine as directed by your caregiver.  If you do not have an appetite, do not force yourself to eat. However, you must continue to drink fluids.  If you have an appetite, eat a normal diet unless your caregiver tells you differently.  Eat a variety of complex carbohydrates (rice, wheat, potatoes, bread), lean meats, yogurt, fruits, and vegetables.  Avoid high-fat foods because they are more difficult to digest.  Drink enough water and fluids to keep your urine clear or pale yellow.  If you are dehydrated, ask your caregiver for specific rehydration instructions. Signs of dehydration may include:  Severe thirst.  Dry lips and mouth.  Dizziness.  Dark urine.  Decreasing urine frequency and amount.  Confusion.  Rapid breathing or pulse. SEEK IMMEDIATE MEDICAL CARE IF:   You have blood or brown flecks (like coffee grounds) in your vomit.  You have black or bloody stools.  You have a severe headache or stiff neck.  You are confused.  You have severe abdominal pain.  You have chest pain or trouble breathing.  You do not urinate at least once every 8 hours.  You develop cold or clammy skin.  You continue to vomit for longer than 24 to 48 hours.  You have a fever. MAKE SURE YOU:   Understand these instructions.  Will watch your condition.  Will get help right away if you are not doing well or get  worse. Document Released: 10/14/2005 Document Revised: 01/06/2012 Document Reviewed: 03/13/2011 [facility] Patient Information 2015 [place], [place]. This information is not intended to replace advice given to you by your health care provider. Make sure you discuss any questions you have with your health care provider.

## 2015-02-04 NOTE — ED Provider Notes (Signed)
CSN: [id number]     Arrival date & time 02/03/15  2217 History   First MD Initiated Contact with Patient 02/03/15 2345     Chief Complaint  Patient presents with  . Hematemesis     (Consider location/radiation/quality/duration/timing/severity/associated sxs/prior Treatment) HPI 59 year old male presents to the emergency department with complaint of 3 days of nausea and vomiting after eating and hematemesis.  He reports he has seen blood mixed in with his food.  He reports today he has noted blood in the toilet bowl with having a bowel movement.  No prior history of GI bleeding.  No prior history of stomach issues.  He denies history of reflux or gastritis.  Patient is a pack every 3 day smoker, occasionally drinks alcohol.  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  No prior endoscopy or colonoscopy History reviewed. No pertinent past medical history. Past Surgical History  Procedure Laterality Date  . [facility]    No family history on file. History  Substance Use Topics  . Smoking status: Current Every Day Smoker -- 0.50 packs/day  . Smokeless tobacco: Never Used  . Alcohol Use: 4.8 oz/week    8 Cans of beer per week     Comment: occ    Review of Systems  See History of Present Illness; otherwise all other systems are reviewed and negative   Allergies  Review of patient's allergies indicates no known allergies.  Home Medications   Prior to Admission medications   Medication Sig Start Date End Date Taking? Authorizing Provider  omeprazole (PRILOSEC) 20 MG capsule Take 1 capsule (20 mg total) by mouth daily. Patient not taking: Reported on 02/03/2015 04/26/14   Linwood DibblesJon Knapp, MD  traMADol (ULTRAM) 50 MG tablet Take 1 tablet (50 mg total) by mouth every 6 (six) hours as needed. Patient not taking: Reported on 02/03/2015 01/14/15   Heather Laisure, PA-C   BP 130/77 mmHg  Pulse 64  Temp(Src) 98.2 F (36.8 C) (Oral)  Resp 20  SpO2  99% Physical Exam  Constitutional: He is oriented to person, place, and time. He appears well-developed and well-nourished.  HENT:  Head: Normocephalic and atraumatic.  Nose: Nose normal.  Mouth/Throat: Oropharynx is clear and moist.  Eyes: Conjunctivae and EOM are normal. Pupils are equal, round, and reactive to light.  Neck: Normal range of motion. Neck supple. No JVD present. No tracheal deviation present. No thyromegaly present.  Cardiovascular: Normal rate, regular rhythm, normal heart sounds and intact distal pulses.  Exam reveals no gallop and no friction rub.   No murmur heard. Pulmonary/Chest: Effort normal and breath sounds normal. No stridor. No respiratory distress. He has no wheezes. He has no rales. He exhibits no tenderness.  Abdominal: Soft. Bowel sounds are normal. He exhibits no distension and no mass. There is no tenderness. There is no rebound and no guarding.  Genitourinary:  No blood noted on glove.  No hemorrhoids, no rectal pain or abscess  Musculoskeletal: Normal range of motion. He exhibits no edema or tenderness.  Lymphadenopathy:    He has no cervical adenopathy.  Neurological: He is alert and oriented to person, place, and time. He displays normal reflexes. He exhibits normal muscle tone. Coordination normal.  Skin: Skin is warm and dry. No rash noted. No erythema. No pallor.  Psychiatric: He has a normal mood and affect. His behavior is normal. Judgment and thought content normal.  Nursing note and vitals reviewed.  ED Course  Procedures (including critical care time) Labs Review Labs Reviewed  COMPREHENSIVE METABOLIC PANEL - Abnormal; Notable for the following:    Glucose, Bld 114 (*)    GFR calc non Af Amer 62 (*)    GFR calc Af Amer 72 (*)    All other components within normal limits  CBC  ETHANOL  URINALYSIS, ROUTINE W REFLEX MICROSCOPIC  LIPASE, BLOOD  POC OCCULT BLOOD, ED  TYPE AND SCREEN  ABO/RH    Imaging Review No results found.    EKG Interpretation None      MDM   Final diagnoses:  Hematemesis with nausea    59 year old male with nausea and vomiting with eating, noticing blood mixed with food when he vomits.  Epigastric tenderness.  History of alcohol abuse.  Per prior notes.  Workup today does not show any acute blood loss anemia, no nausea and vomiting here in the emergency department.  Differential includes Mallory-Weiss tear, gastritis, ulcer.  Will refer patient to primary care doctor, as well as gastroenterology.  Will start on Protonix and Carafate.  Marisa Severin, MD 02/04/15 [phone]

## 2016-03-27 ENCOUNTER — Emergency Department (HOSPITAL_COMMUNITY)
Admission: EM | Admit: 2016-03-27 | Discharge: 2016-03-27 | Disposition: A | Discharge status: Home / Self Care | Payer: Self-pay | Attending: Emergency Medicine | Admitting: Emergency Medicine

## 2016-03-27 ENCOUNTER — Encounter (HOSPITAL_COMMUNITY): Payer: Self-pay

## 2016-03-27 ENCOUNTER — Emergency Department (HOSPITAL_COMMUNITY): Payer: Self-pay

## 2016-03-27 DIAGNOSIS — J209 Acute bronchitis, unspecified: Secondary | ICD-10-CM | POA: Insufficient documentation

## 2016-03-27 DIAGNOSIS — Z720 Tobacco use: Secondary | ICD-10-CM

## 2016-03-27 DIAGNOSIS — Z792 Long term (current) use of antibiotics: Secondary | ICD-10-CM | POA: Insufficient documentation

## 2016-03-27 DIAGNOSIS — F17200 Nicotine dependence, unspecified, uncomplicated: Secondary | ICD-10-CM | POA: Insufficient documentation

## 2016-03-27 DIAGNOSIS — Z79899 Other long term (current) drug therapy: Secondary | ICD-10-CM | POA: Insufficient documentation

## 2016-03-27 MED ORDER — PREDNISONE 20 MG PO TABS
60.0000 mg | ORAL_TABLET | Freq: Once | ORAL | Status: AC
  Administered 2016-03-27: 60 mg via ORAL
  Filled 2016-03-27: qty 3

## 2016-03-27 MED ORDER — ALBUTEROL SULFATE HFA 108 (90 BASE) MCG/ACT IN AERS
2.0000 | INHALATION_SPRAY | Freq: Once | RESPIRATORY_TRACT | Status: AC
  Administered 2016-03-27: 2 via RESPIRATORY_TRACT
  Filled 2016-03-27: qty 6.7

## 2016-03-27 MED ORDER — AZITHROMYCIN 250 MG PO TABS: 250.0000 mg | ORAL_TABLET | Freq: Every day | ORAL | Status: DC

## 2016-03-27 MED ORDER — AZITHROMYCIN 250 MG PO TABS
500.0000 mg | ORAL_TABLET | Freq: Once | ORAL | Status: AC
  Administered 2016-03-27: 500 mg via ORAL
  Filled 2016-03-27: qty 2

## 2016-03-27 MED ORDER — PREDNISONE 10 MG (21) PO TBPK: 10.0000 mg | ORAL_TABLET | Freq: Every day | ORAL | Status: DC

## 2016-03-27 NOTE — Discharge Instructions (Signed)

## 2016-03-27 NOTE — ED Provider Notes (Signed)
CSN: [id number]     Arrival date & time 03/27/16  1055 History   First MD Initiated Contact with Patient 03/27/16 1543     Chief Complaint  Patient presents with  . Cough   Pt has had a 2 week hx of cough.  He said he brings up white phlegm.  The pt does not have a pcp, so he came here.  Pt denies fever or chills.  (Consider location/radiation/quality/duration/timing/severity/associated sxs/prior Treatment) Patient is a 60 y.o. male presenting with cough. The history is provided by the patient.  Cough Cough characteristics:  Productive Sputum characteristics:  White Severity:  Moderate Onset quality:  Gradual Timing:  Intermittent Chronicity:  New Relieved by:  Nothing Worsened by:  Nothing tried   History reviewed. No pertinent past medical history. Past Surgical History  Procedure Laterality Date  . [facility]    History reviewed. No pertinent family history. Social History  Substance Use Topics  . Smoking status: Current Every Day Smoker -- 0.50 packs/day  . Smokeless tobacco: Never Used  . Alcohol Use: 4.8 oz/week    8 Cans of beer per week     Comment: occ    Review of Systems  Respiratory: Positive for cough.   All other systems reviewed and are negative.     Allergies  Review of patient's allergies indicates no known allergies.  Home Medications   Prior to Admission medications   Medication Sig Start Date End Date Taking? Authorizing Provider  azithromycin (ZITHROMAX Z-PAK) 250 MG tablet Take 1 tablet (250 mg total) by mouth daily. 03/27/16   Jacalyn LefevreJulie Maybell Misenheimer, MD  omeprazole (PRILOSEC) 20 MG capsule Take 1 capsule (20 mg total) by mouth daily. Patient not taking: Reported on 02/03/2015 04/26/14   Linwood DibblesJon Knapp, MD  ondansetron (ZOFRAN ODT) 8 MG disintegrating tablet Take 1 tablet (8 mg total) by mouth every 8 (eight) hours as needed for nausea or vomiting. 02/04/15   Marisa Severinlga Otter, MD  pantoprazole (PROTONIX) 20 MG tablet Take 2  tablets (40 mg total) by mouth daily. 02/04/15   Marisa Severinlga Otter, MD  predniSONE (STERAPRED UNI-PAK 21 TAB) 10 MG (21) TBPK tablet Take 1 tablet (10 mg total) by mouth daily. Take 6 tabs by mouth daily  for 2 days, then 5 tabs for 2 days, then 4 tabs for 2 days, then 3 tabs for 2 days, 2 tabs for 2 days, then 1 tab by mouth daily for 2 days 03/27/16   Jacalyn LefevreJulie Jasmine Mcbeth, MD  sucralfate (CARAFATE) 1 G tablet Take 1 tablet (1 g total) by mouth 4 (four) times daily. 02/04/15   Marisa Severinlga Otter, MD  traMADol (ULTRAM) 50 MG tablet Take 1 tablet (50 mg total) by mouth every 6 (six) hours as needed. Patient not taking: Reported on 02/03/2015 01/14/15   Heather Laisure, PA-C   BP 122/69 mmHg  Pulse 57  Temp(Src) 98.2 F (36.8 C) (Oral)  Resp 16  Ht 5\' 5"  (1.651 m)  Wt 150 lb (68.04 kg)  BMI 24.96 kg/m2  SpO2 99% Physical Exam  Constitutional: He is oriented to person, place, and time. He appears well-developed and well-nourished.  HENT:  Head: Normocephalic and atraumatic.  Right Ear: External ear normal.  Left Ear: External ear normal.  Nose: Nose normal.  Mouth/Throat: Oropharynx is clear and moist.  Eyes: Conjunctivae and EOM are normal. Pupils are equal, round, and reactive to light.  Neck: Normal range of motion. Neck supple.  Cardiovascular: Normal  rate, regular rhythm, normal heart sounds and intact distal pulses.   Pulmonary/Chest: Effort normal. He has wheezes.  Abdominal: Soft. Bowel sounds are normal.  Musculoskeletal: Normal range of motion.  Neurological: He is alert and oriented to person, place, and time.  Skin: Skin is warm and dry.  Psychiatric: He has a normal mood and affect. His behavior is normal. Judgment and thought content normal.  Nursing note and vitals reviewed.   ED Course  Procedures (including critical care time) Labs Review Labs Reviewed - No data to display  Imaging Review Dg Chest 2 View  03/27/2016  CLINICAL DATA:  Productive cough over the past 2 weeks. EXAM: CHEST  2  VIEW COMPARISON:  11/03/2010 FINDINGS: Minimal degenerative bilateral AC joint arthropathy. Minimal thoracic spondylosis. The lungs appear clear. Cardiac and mediastinal contours normal. No pleural effusion identified. IMPRESSION: 1.  No active cardiopulmonary disease is radiographically apparent. Electronically Signed   By: Gaylyn Rong M.D.   On: 03/27/2016 11:48   I have personally reviewed and evaluated these images and lab results as part of my medical decision-making.   EKG Interpretation None      MDM  I spoke to pt about stopping smoking.  He knows to return if worse. Final diagnoses:  Tobacco abuse  Acute bronchitis, unspecified organism       Jacalyn Lefevre, MD 03/27/16 [phone]

## 2016-03-27 NOTE — ED Notes (Signed)
Pt presents with 2 week h/o cough.  Pt reports cough is productive with white phlegm; denies shortness of breath.  Pt reports taking multiple OTC medications without relief.

## 2017-01-31 ENCOUNTER — Encounter: Payer: Self-pay | Admitting: Family Medicine

## 2017-01-31 ENCOUNTER — Ambulatory Visit (INDEPENDENT_AMBULATORY_CARE_PROVIDER_SITE_OTHER): Payer: Self-pay | Admitting: Family Medicine

## 2017-01-31 VITALS — BP 138/70 | HR 67 | Temp 98.4°F | Resp 16 | Ht 65.0 in | Wt 148.0 lb

## 2017-01-31 DIAGNOSIS — R350 Frequency of micturition: Secondary | ICD-10-CM

## 2017-01-31 DIAGNOSIS — R1084 Generalized abdominal pain: Secondary | ICD-10-CM

## 2017-01-31 DIAGNOSIS — Z131 Encounter for screening for diabetes mellitus: Secondary | ICD-10-CM

## 2017-01-31 DIAGNOSIS — K921 Melena: Secondary | ICD-10-CM

## 2017-01-31 DIAGNOSIS — R5383 Other fatigue: Secondary | ICD-10-CM

## 2017-01-31 LAB — COMPREHENSIVE METABOLIC PANEL
ALK PHOS: 50 U/L (ref 40–115)
ALT: 20 U/L (ref 9–46)
AST: 24 U/L (ref 10–35)
Albumin: 4.1 g/dL (ref 3.6–5.1)
BILIRUBIN TOTAL: 0.5 mg/dL (ref 0.2–1.2)
BUN: 14 mg/dL (ref 7–25)
CALCIUM: 9.4 mg/dL (ref 8.6–10.3)
CO2: 25 mmol/L (ref 20–31)
Chloride: 105 mmol/L (ref 98–110)
Creat: 1.12 mg/dL (ref 0.70–1.25)
Glucose, Bld: 73 mg/dL (ref 65–99)
Potassium: 4.3 mmol/L (ref 3.5–5.3)
Sodium: 139 mmol/L (ref 135–146)
TOTAL PROTEIN: 7 g/dL (ref 6.1–8.1)

## 2017-01-31 LAB — CBC WITH DIFFERENTIAL/PLATELET
BASOS PCT: 1 %
Basophils Absolute: 88 cells/uL (ref 0–200)
EOS ABS: 264 {cells}/uL (ref 15–500)
Eosinophils Relative: 3 %
HEMATOCRIT: 40.3 % (ref 38.5–50.0)
Hemoglobin: 13.5 g/dL (ref 13.2–17.1)
LYMPHS ABS: 2112 {cells}/uL (ref 850–3900)
Lymphocytes Relative: 24 %
MCH: 29.2 pg (ref 27.0–33.0)
MCHC: 33.5 g/dL (ref 32.0–36.0)
MCV: 87 fL (ref 80.0–100.0)
MONO ABS: 616 {cells}/uL (ref 200–950)
MPV: 9.2 fL (ref 7.5–12.5)
Monocytes Relative: 7 %
Neutro Abs: 5720 cells/uL (ref 1500–7800)
Neutrophils Relative %: 65 %
Platelets: 273 10*3/uL (ref 140–400)
RBC: 4.63 MIL/uL (ref 4.20–5.80)
RDW: 13.4 % (ref 11.0–15.0)
WBC: 8.8 10*3/uL (ref 3.8–10.8)

## 2017-01-31 LAB — POCT URINALYSIS DIP (DEVICE)
Bilirubin Urine: NEGATIVE
GLUCOSE, UA: NEGATIVE mg/dL
Hgb urine dipstick: NEGATIVE
Ketones, ur: NEGATIVE mg/dL
LEUKOCYTES UA: NEGATIVE
NITRITE: NEGATIVE
Protein, ur: NEGATIVE mg/dL
Specific Gravity, Urine: 1.02 (ref 1.005–1.030)
UROBILINOGEN UA: 0.2 mg/dL (ref 0.0–1.0)
pH: 6 (ref 5.0–8.0)

## 2017-01-31 LAB — THYROID PANEL WITH TSH
FREE THYROXINE INDEX: 2.3 (ref 1.4–3.8)
T3 UPTAKE: 30 % (ref 22–35)
T4, Total: 7.8 ug/dL (ref 4.5–12.0)
TSH: 0.68 m[IU]/L (ref 0.40–4.50)

## 2017-01-31 LAB — LIPID PANEL
CHOLESTEROL: 206 mg/dL — AB (ref <200)
HDL: 79 mg/dL (ref >40)
LDL CALC: 109 mg/dL — AB (ref <100)
Total CHOL/HDL Ratio: 2.6 Ratio (ref <5.0)
Triglycerides: 90 mg/dL (ref <150)
VLDL: 18 mg/dL (ref <30)

## 2017-01-31 MED ORDER — OMEPRAZOLE 20 MG PO CPDR: 20.0000 mg | DELAYED_RELEASE_CAPSULE | Freq: Two times a day (BID) | ORAL | 3 refills | Status: DC

## 2017-01-31 NOTE — Progress Notes (Signed)
Patient ID: Scott Tate, male    DOB: [birth year], 61 y.o.   MRN: [id number]  PCP: Joaquin Courts, FNP  Chief Complaint  Patient presents with  . Establish Care  . Abdominal Pain    4 TO 5 MONTHS  . Blood In Stools    4 TO 5 MONTHS  . Urinary Frequency    Subjective:  HPI Scott Tate is a 61 y.o. male presents to establish care and reports the following problems abdominal pain, bloody stools, and urinary frequency. 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  Scott Tate is reports that he has relied on care through the emergency department as he has been without health insurance for a long period of time. He reports never having a colonoscopy and to his knowledge never receiving a prostate screening.  Reports a 4-5 month history of intermittent bloody stools. Stools include bright red streaks of blood. Accompanying these bloody stools he reports ongoing abdominal pain. Pain in generalized expanding throughout all four quadrants of the abdomen. Characterizes pain as aching although occasionally sharp. Rest to relieve pain. Denies diarrhea, nausea, vomiting, or dizziness. Reports over the last year that he has lost weight without trying and he has experienced fatigue. He denies any family history of cancer.  Social History   Social History  . Marital status: Married    Spouse name: N/A  . Number of children: N/A  . Years of education: N/A   Occupational History  . Not on file.   Social History Main Topics  . Smoking status: Current Every Day Smoker    Packs/day: 0.50  . Smokeless tobacco: Never Used  . Alcohol use 4.8 oz/week    8 Cans of beer per week     Comment: occ  . Drug use: Yes    Types: Marijuana, Cocaine     Comment: last use 12/2014  . Sexual activity: Not on file   Other Topics Concern  . Not on file   Social History Narrative  . No narrative on file   Review of Systems See HPI  Prior to Admission medications   Medication  Sig Start Date End Date Taking? Authorizing Provider  omeprazole (PRILOSEC) 20 MG capsule Take 1 capsule (20 mg total) by mouth daily. Patient not taking: Reported on 02/03/2015 04/26/14   Linwood Dibbles, MD  ondansetron (ZOFRAN ODT) 8 MG disintegrating tablet Take 1 tablet (8 mg total) by mouth every 8 (eight) hours as needed for nausea or vomiting. Patient not taking: Reported on 01/31/2017 02/04/15   Marisa Severin, MD  pantoprazole (PROTONIX) 20 MG tablet Take 2 tablets (40 mg total) by mouth daily. Patient not taking: Reported on 01/31/2017 02/04/15   Marisa Severin, MD  sucralfate (CARAFATE) 1 G tablet Take 1 tablet (1 g total) by mouth 4 (four) times daily. Patient not taking: Reported on 01/31/2017 02/04/15   Marisa Severin, MD  traMADol (ULTRAM) 50 MG tablet Take 1 tablet (50 mg total) by mouth every 6 (six) hours as needed. Patient not taking: Reported on 02/03/2015 01/14/15   Santiago Glad, PA-C    Past Medical, Surgical Family and Social History reviewed and updated.    Objective:   Today's Vitals   01/31/17 1326  BP: 138/70  Pulse: 67  Resp: 16  Temp: 98.4 F (36.9 C)  TempSrc: Oral  SpO2: 98%  Weight: 148 lb (67.1 kg)  Height:  (1.651 m)    Wt Readings from Last 3 Encounters:  01/31/17 148 lb (67.1 kg)  03/27/16 150 lb (68 kg)  01/14/15 155 lb (70.3 kg)   Physical Exam  Constitutional: He is oriented to person, place, and time. He appears well-developed and well-nourished.  HENT:  Head: Normocephalic and atraumatic.  Eyes: Conjunctivae are normal. Pupils are equal, round, and reactive to light.  Neck: Normal range of motion. Neck supple.  Cardiovascular: Normal rate, regular rhythm, normal heart sounds and intact distal pulses.   Pulmonary/Chest: Effort normal and breath sounds normal.  Abdominal: Soft. Bowel sounds are normal. He exhibits no distension and no mass. There is no tenderness. There is no rebound and no guarding.  Genitourinary: Rectal exam shows no external hemorrhoid, no  internal hemorrhoid, no fissure, no mass, no tenderness, anal tone normal and guaiac negative stool. Prostate is tender. Prostate is not enlarged.  Musculoskeletal: Normal range of motion.  Neurological: He is alert and oriented to person, place, and time.  Skin: Skin is warm and dry.  Psychiatric: He has a normal mood and affect. His behavior is normal. Judgment and thought content normal.     Assessment & Plan:  1. Generalized abdominal pain - Ambulatory referral to Gastroenterology -Start Omeprazole 20 mg, twice daily as a trial to see if this resolves abdominal pain.  2. Urinary frequency - PSA  3. Screening for diabetes mellitus - Hemoglobin A1c - Lipid panel  4. Fatigue, unspecified type - Thyroid Panel With TSH   5. Blood in stool - Ambulatory referral to Gastroenterology - CT Abdomen Pelvis W Contrast   Godfrey Pick. Tiburcio Pea, MSN, [facility] [address] [place], [place] [address] [phone]

## 2017-01-31 NOTE — Patient Instructions (Addendum)
We will schedule you for a CT of abdomen for abdominal pain and blood in stool.  We will contact your regarding any abnormal labs.  I would like to start you on a trial of omeprazole to see if this helps with the abdominal pain.    Once I have the CT results back and you complete the [facility] Assistance form, we can get you scheduled with Gastroenterology.      Colon Polyps Polyps are tissue growths inside the body. Polyps can grow in many places, including the large intestine (colon). A polyp may be a round bump or a mushroom-shaped growth. You could have one polyp or several. Most colon polyps are noncancerous (benign). However, some colon polyps can become cancerous over time. What are the causes? The exact cause of colon polyps is not known. What increases the risk? This condition is more likely to develop in people who:  Have a family history of colon cancer or colon polyps.  Are older than 36 or older than 45 if they are African American.  Have inflammatory bowel disease, such as ulcerative colitis or Crohn disease.  Are overweight.  Smoke cigarettes.  Do not get enough exercise.  Drink too much alcohol.  Eat a diet that is:  High in fat and red meat.  Low in fiber.  Had childhood cancer that was treated with abdominal radiation. What are the signs or symptoms? Most polyps do not cause symptoms. If you have symptoms, they may include:  Blood coming from your rectum when having a bowel movement.  Blood in your stool.The stool may look dark red or black.  A change in bowel habits, such as constipation or diarrhea. How is this diagnosed? This condition is diagnosed with a colonoscopy. This is a procedure that uses a lighted, flexible scope to look at the inside of your colon. How is this treated? Treatment for this condition involves removing any polyps that are found. Those polyps will then be tested for cancer. If cancer is found, your health care  provider will talk to you about options for colon cancer treatment. Follow these instructions at home: Diet   Eat plenty of fiber, such as fruits, vegetables, and whole grains.  Eat foods that are high in calcium and vitamin D, such as milk, cheese, yogurt, eggs, liver, fish, and broccoli.  Limit foods high in fat, red meats, and processed meats, such as hot dogs, sausage, bacon, and lunch meats.  Maintain a healthy weight, or lose weight if recommended by your health care provider. General instructions   Do not smoke cigarettes.  Do not drink alcohol excessively.  Keep all follow-up visits as told by your health care provider. This is important. This includes keeping regularly scheduled colonoscopies. Talk to your health care provider about when you need a colonoscopy.  Exercise every day or as told by your health care provider. Contact a health care provider if:  You have new or worsening bleeding during a bowel movement.  You have new or increased blood in your stool.  You have a change in bowel habits.  You unexpectedly lose weight. This information is not intended to replace advice given to you by your health care provider. Make sure you discuss any questions you have with your health care provider. Document Released: 07/10/2004 Document Revised: 03/21/2016 Document Reviewed: 09/04/2015 Elsevier Interactive Patient Education  2017 [employer].

## 2017-02-01 LAB — PSA: PSA: 1.8 ng/mL (ref <=4.0)

## 2017-02-01 LAB — HEMOGLOBIN A1C
HEMOGLOBIN A1C: 5.4 % (ref <5.7)
Mean Plasma Glucose: 108 mg/dL

## 2017-02-06 ENCOUNTER — Ambulatory Visit (HOSPITAL_COMMUNITY)
Admission: RE | Admit: 2017-02-06 | Discharge: 2017-02-06 | Disposition: A | Discharge status: Home / Self Care | Payer: Self-pay | Source: Ambulatory Visit | Attending: Family Medicine | Admitting: Family Medicine

## 2017-02-06 ENCOUNTER — Other Ambulatory Visit: Payer: Self-pay | Admitting: Family Medicine

## 2017-02-06 DIAGNOSIS — K921 Melena: Secondary | ICD-10-CM | POA: Insufficient documentation

## 2017-02-06 DIAGNOSIS — R634 Abnormal weight loss: Secondary | ICD-10-CM

## 2017-02-06 DIAGNOSIS — R1084 Generalized abdominal pain: Secondary | ICD-10-CM | POA: Insufficient documentation

## 2017-02-06 DIAGNOSIS — M47816 Spondylosis without myelopathy or radiculopathy, lumbar region: Secondary | ICD-10-CM | POA: Insufficient documentation

## 2017-02-06 DIAGNOSIS — Z905 Acquired absence of kidney: Secondary | ICD-10-CM | POA: Insufficient documentation

## 2017-02-06 DIAGNOSIS — R5383 Other fatigue: Secondary | ICD-10-CM

## 2017-02-06 MED ORDER — IOPAMIDOL (ISOVUE-300) INJECTION 61%
INTRAVENOUS | Status: AC
  Filled 2017-02-06: qty 100

## 2017-02-06 MED ORDER — IOPAMIDOL (ISOVUE-300) INJECTION 61%
100.0000 mL | Freq: Once | INTRAVENOUS | Status: AC | PRN
  Administered 2017-02-06: 100 mL via INTRAVENOUS

## 2017-02-25 ENCOUNTER — Ambulatory Visit: Payer: Self-pay | Attending: Internal Medicine

## 2017-03-03 ENCOUNTER — Ambulatory Visit (INDEPENDENT_AMBULATORY_CARE_PROVIDER_SITE_OTHER): Payer: Self-pay | Admitting: Family Medicine

## 2017-03-03 ENCOUNTER — Encounter: Payer: Self-pay | Admitting: Family Medicine

## 2017-03-03 VITALS — BP 110/72 | HR 73 | Temp 98.2°F | Resp 18 | Ht 65.0 in | Wt 148.0 lb

## 2017-03-03 DIAGNOSIS — R350 Frequency of micturition: Secondary | ICD-10-CM

## 2017-03-03 DIAGNOSIS — R1010 Upper abdominal pain, unspecified: Secondary | ICD-10-CM

## 2017-03-03 DIAGNOSIS — Z23 Encounter for immunization: Secondary | ICD-10-CM

## 2017-03-03 DIAGNOSIS — N281 Cyst of kidney, acquired: Secondary | ICD-10-CM

## 2017-03-03 LAB — POCT URINALYSIS DIP (DEVICE)
BILIRUBIN URINE: NEGATIVE
Glucose, UA: NEGATIVE mg/dL
HGB URINE DIPSTICK: NEGATIVE
KETONES UR: NEGATIVE mg/dL
LEUKOCYTES UA: NEGATIVE
Nitrite: NEGATIVE
Protein, ur: NEGATIVE mg/dL
SPECIFIC GRAVITY, URINE: 1.02 (ref 1.005–1.030)
Urobilinogen, UA: 1 mg/dL (ref 0.0–1.0)
pH: 6.5 (ref 5.0–8.0)

## 2017-03-03 NOTE — Patient Instructions (Signed)
[facility] [phone] for omeprazole prescription.    Food Choices for Gastroesophageal Reflux Disease, Adult When you have gastroesophageal reflux disease (GERD), the foods you eat and your eating habits are very important. Choosing the right foods can help ease your discomfort. What guidelines do I need to follow?  Choose fruits, vegetables, whole grains, and low-fat dairy products.  Choose low-fat meat, fish, and poultry.  Limit fats such as oils, salad dressings, butter, nuts, and avocado.  Keep a food diary. This helps you identify foods that cause symptoms.  Avoid foods that cause symptoms. These may be different for everyone.  Eat small meals often instead of 3 large meals a day.  Eat your meals slowly, in a place where you are relaxed.  Limit fried foods.  Cook foods using methods other than frying.  Avoid drinking alcohol.  Avoid drinking large amounts of liquids with your meals.  Avoid bending over or lying down until 2-3 hours after eating. What foods are not recommended? These are some foods and drinks that may make your symptoms worse: Vegetables  Tomatoes. Tomato juice. Tomato and spaghetti sauce. Chili peppers. Onion and garlic. Horseradish. Fruits  Oranges, grapefruit, and lemon (fruit and juice). Meats  High-fat meats, fish, and poultry. This includes hot dogs, ribs, ham, sausage, salami, and bacon. Dairy  Whole milk and chocolate milk. Sour cream. Cream. Butter. Ice cream. Cream cheese. Drinks  Coffee and tea. Bubbly (carbonated) drinks or energy drinks. Condiments  Hot sauce. Barbecue sauce. Sweets/Desserts  Chocolate and cocoa. Donuts. Peppermint and spearmint. Fats and Oils  High-fat foods. This includes [place] fries and potato chips. Other  Vinegar. Strong spices. This includes black pepper, white pepper, red pepper, cayenne, curry powder, cloves, ginger, and chili powder. The items listed above may not be a  complete list of foods and drinks to avoid. Contact your dietitian for more information.  This information is not intended to replace advice given to you by your health care provider. Make sure you discuss any questions you have with your health care provider. Document Released: 04/14/2012 Document Revised: 03/21/2016 Document Reviewed: 08/18/2013 Elsevier Interactive Patient Education  2017 [employer]

## 2017-03-03 NOTE — Progress Notes (Signed)
Patient ID: Scott Tate, male    DOB: 1956-10-08, 61 y.o.   MRN: [id number]  PCP: Bing Neighbors, FNP  Chief Complaint  Patient presents with  . Follow-up    1 month    Subjective:  HPI Scott Tate is a 61 y.o. male presents for 1 month follow-up for abdominal pain and urinary frequency. Scott Tate reports improvement of abdominal pain since starting the omeprazole. He had a recent CT of abdomen which was negative of any acute abdominal abnormalities. Reports no recent blood in stools and is having daily bowel movements. Continues to experience urinary frequency, which is worst at night.  He was screened for diabetes which was negative. CT abdomen, did note the presence of a renal cyst. Although Scott Tate is negative of any related flank pain. He also denies urine hesitancy and or weakened urine stream. He is in the process of applying for healthcare financial assistance.  Social History   Social History  . Marital status: Married    Spouse name: N/A  . Number of children: N/A  . Years of education: N/A   Occupational History  . Not on file.   Social History Main Topics  . Smoking status: Current Every Day Smoker    Packs/day: 0.50  . Smokeless tobacco: Never Used  . Alcohol use 4.8 oz/week    8 Cans of beer per week     Comment: occ  . Drug use: Yes    Types: Marijuana, Cocaine     Comment: last use 12/2014  . Sexual activity: Not on file   Other Topics Concern  . Not on file   Social History Narrative  . No narrative on file    Review of Systems See HPI  Prior to Admission medications   Medication Sig Start Date End Date Taking? Authorizing Provider  omeprazole (PRILOSEC) 20 MG capsule Take 1 capsule (20 mg total) by mouth 2 (two) times daily before a meal. 01/31/17  Yes Bing Neighbors, FNP  ondansetron (ZOFRAN ODT) 8 MG disintegrating tablet Take 1 tablet (8 mg total) by mouth every 8 (eight) hours as needed for nausea or vomiting. 02/04/15  Yes  Marisa Severin, MD  pantoprazole (PROTONIX) 20 MG tablet Take 2 tablets (40 mg total) by mouth daily. 02/04/15  Yes Marisa Severin, MD  sucralfate (CARAFATE) 1 G tablet Take 1 tablet (1 g total) by mouth 4 (four) times daily. 02/04/15  Yes Marisa Severin, MD  traMADol (ULTRAM) 50 MG tablet Take 1 tablet (50 mg total) by mouth every 6 (six) hours as needed. 01/14/15  Yes Santiago Glad, PA-C    Past Medical, Surgical Family and Social History reviewed and updated.    Objective:   Today's Vitals   03/03/17 1342  BP: 110/72  Pulse: 73  Resp: 18  Temp: 98.2 F (36.8 C)  TempSrc: Oral  SpO2: 97%  Weight: 148 lb (67.1 kg)  Height: 5\' 5"  (1.651 m)    Wt Readings from Last 3 Encounters:  03/03/17 148 lb (67.1 kg)  01/31/17 148 lb (67.1 kg)  03/27/16 150 lb (68 kg)    Physical Exam  Constitutional: He is oriented to person, place, and time. He appears well-developed and well-nourished.  HENT:  Head: Normocephalic and atraumatic.  Eyes: Conjunctivae are normal. Pupils are equal, round, and reactive to light.  Neck: Normal range of motion.  Cardiovascular: Normal rate, regular rhythm, normal heart sounds and intact distal pulses.   Pulmonary/Chest: Effort normal and  breath sounds normal.  Abdominal: Soft. Bowel sounds are normal. He exhibits no distension.  Musculoskeletal: Normal range of motion.  Neurological: He is alert and oriented to person, place, and time.  Psychiatric: He has a normal mood and affect. His behavior is normal. Judgment and thought content normal.     Assessment & Plan:  1. Renal cyst, right 2. 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 He will need a referral to urology for further evaluation. At present, he has coverage, although he is in the process for applying for financial assistance.  3. Pain of upper abdomen -Continue omeprazole. Symptoms likely related to GERD. -Colonoscopy  referral pending , approval for financial assistance   4. Need for diphtheria-tetanus-pertussis (Tdap) vaccine - Tdap vaccine greater than or equal to 7yo IM  RTC: Patient it to notify me by phone once financial assistance is approved for a referral to GI and Urology.   Godfrey PickKimberly S. Tiburcio PeaHarris, MSN, [facility] [address] [place], [place] [address] [phone]

## 2017-03-16 ENCOUNTER — Other Ambulatory Visit: Payer: Self-pay | Admitting: Family Medicine

## 2017-03-16 DIAGNOSIS — N281 Cyst of kidney, acquired: Secondary | ICD-10-CM

## 2017-03-16 NOTE — Progress Notes (Signed)
Referring to urology to evaluate right renal cyst

## 2017-04-01 ENCOUNTER — Telehealth: Payer: Self-pay

## 2017-04-03 NOTE — Telephone Encounter (Signed)
[facility] at [phone] and [facility] at [phone]

## 2017-04-26 ENCOUNTER — Encounter (HOSPITAL_COMMUNITY): Payer: Self-pay | Admitting: Emergency Medicine

## 2017-04-26 ENCOUNTER — Emergency Department (HOSPITAL_COMMUNITY)
Admission: EM | Admit: 2017-04-26 | Discharge: 2017-04-26 | Disposition: A | Discharge status: Home / Self Care | Payer: Self-pay | Attending: Emergency Medicine | Admitting: Emergency Medicine

## 2017-04-26 DIAGNOSIS — K644 Residual hemorrhoidal skin tags: Secondary | ICD-10-CM | POA: Insufficient documentation

## 2017-04-26 DIAGNOSIS — F17200 Nicotine dependence, unspecified, uncomplicated: Secondary | ICD-10-CM | POA: Insufficient documentation

## 2017-04-26 DIAGNOSIS — Z79899 Other long term (current) drug therapy: Secondary | ICD-10-CM | POA: Insufficient documentation

## 2017-04-26 MED ORDER — HYDROCORTISONE 2.5 % EX CREA: TOPICAL_CREAM | Freq: Two times a day (BID) | CUTANEOUS | 0 refills | Status: DC

## 2017-04-26 MED ORDER — LIDOCAINE 4 % EX CREA: 1.0000 "application " | TOPICAL_CREAM | CUTANEOUS | 0 refills | Status: DC | PRN

## 2017-04-26 NOTE — Discharge Instructions (Signed)
Apply hydrocortisone cream as prescribed for inflammation. Apply lidocaine cream for pain control. Sitz baths several times a day. Follow up with family doctor if not improving.

## 2017-04-26 NOTE — ED Provider Notes (Signed)
[facility]-EMERGENCY DEPT Provider Note   CSN: [id number] Arrival date & time: 04/26/17  1037     History   Chief Complaint Chief Complaint  Patient presents with  . Hemorrhoids    HPI Scott Tate is a 61 y.o. male.  HPI Scott Tate is a 61 y.o. male with hx of hemorrhoids, presents to ED with complaint of or painful Hemorrhoid. Patient states he noticed some pain about 3 days ago. He states he has used Preparation H wipes which did not help. He states it is painful for him to sit down. Nothing making it better. Denies any fever or chills. Minimal pain with bowel movements. Saw some blood on toilet paper yesterday. No other complaints.   History reviewed. No pertinent past medical history.  There are no active problems to display for this patient.   Past Surgical History:  Procedure Laterality Date  . 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 KIDNEY DONATION    . PANCREATECTOMY         Home Medications    Prior to Admission medications   Medication Sig Start Date End Date Taking? Authorizing Provider  Multiple Vitamin (MULTIVITAMIN WITH MINERALS) TABS tablet Take 1 tablet by mouth daily.   Yes [provider]  omeprazole (PRILOSEC) 20 MG capsule Take 1 capsule (20 mg total) by mouth 2 (two) times daily before a meal. 01/31/17  Yes Bing NeighborsHarris, Kimberly S, FNP  traMADol (ULTRAM) 50 MG tablet Take 1 tablet (50 mg total) by mouth every 6 (six) hours as needed. 01/14/15  Yes Santiago GladLaisure, Heather, PA-C  hydrocortisone 2.5 % cream Apply topically 2 (two) times daily. 04/26/17   Jhan Conery, PA-C  lidocaine (LMX) 4 % cream Apply 1 application topically as needed. 04/26/17   Jaynie CrumbleKirichenko, Harmoni Lucus, PA-C    Family History History reviewed. No pertinent family history.  Social History Social History  Substance Use Topics  . Smoking status: Current Every Day Smoker    Packs/day: 0.50  . Smokeless tobacco: Never Used  . Alcohol use 4.8 oz/week    8 Cans of beer per  week     Comment: occ     Allergies   Patient has no known allergies.   Review of Systems Review of Systems  Gastrointestinal: Positive for blood in stool and rectal pain.  Skin: Positive for wound.  Neurological: Negative for weakness and numbness.  All other systems reviewed and are negative.    Physical Exam Updated Vital Signs BP 125/73 (BP Location: Right Arm)   Pulse 62   Temp 98.4 F (36.9 C) (Oral)   Resp 20   Ht 5\' 5"  (1.651 m)   Wt 66.2 kg (146 lb)   SpO2 100%   BMI 24.30 kg/m   Physical Exam  Constitutional: He appears well-developed and well-nourished. No distress.  Eyes: Conjunctivae are normal.  Neck: Neck supple.  Cardiovascular: Normal rate.   Pulmonary/Chest: No respiratory distress.  Abdominal: He exhibits no distension.  Genitourinary:  Genitourinary Comments: 1 small, approximately 1 cm diameter external hemorrhoid to the left of the rectum, tender to the touch. Hemorrhoid is soft, pink, nonthrombosed. No bleeding. The rest of the rectum is nontender.  Skin: Skin is warm and dry.  Nursing note and vitals reviewed.    ED Treatments / Results  Labs (all labs ordered are listed, but only abnormal results are displayed) Labs Reviewed - No data to display  EKG  EKG Interpretation None  Radiology No results found.  Procedures Procedures (including critical care time)  Medications Ordered in ED Medications - No data to display   Initial Impression / Assessment and Plan / ED Course  I have reviewed the triage vital signs and the nursing notes.  Pertinent labs & imaging results that were available during my care of the patient were reviewed by me and considered in my medical decision making (see chart for details).     Patient with external hemorrhoid, no signs of thrombosis. Will treat with hydrocortisone cream, lidocaine, sitz baths. I did not see any fizzues, and do not think pt has a rectal or perirectal abscess. Follow-up  with family doctor  Vitals:   04/26/17 1106 04/26/17 1238 04/26/17 1334  BP: 133/77 (!) 154/104 125/73  Pulse: 60 65 62  Resp: 17 18 20   Temp: 98.3 F (36.8 C) 98.1 F (36.7 C) 98.4 F (36.9 C)  TempSrc: Oral Oral Oral  SpO2: 100% 100% 100%  Weight: 66.2 kg (146 lb)    Height: 5\' 5"  (1.651 m)       Final Clinical Impressions(s) / ED Diagnoses   Final diagnoses:  External hemorrhoid    New Prescriptions Discharge Medication List as of 04/26/2017  1:24 PM    START taking these medications   Details  hydrocortisone 2.5 % cream Apply topically 2 (two) times daily., Starting Sat 04/26/2017, Print    lidocaine (LMX) 4 % cream Apply 1 application topically as needed., Starting Sat 04/26/2017, Print         Trellis Moment Gardner, PA-C 04/26/17 1519    Mesner, Barbara Cower, MD 04/27/17 1606

## 2017-04-26 NOTE — ED Triage Notes (Signed)
Pt reports pain from hemorrhoids for the past 2 days which cause pain when he sits.

## 2017-09-04 ENCOUNTER — Ambulatory Visit: Payer: Self-pay | Admitting: Family Medicine

## 2017-09-08 ENCOUNTER — Encounter: Payer: Self-pay | Admitting: Family Medicine

## 2017-09-08 ENCOUNTER — Ambulatory Visit (INDEPENDENT_AMBULATORY_CARE_PROVIDER_SITE_OTHER): Payer: Self-pay | Admitting: Family Medicine

## 2017-09-08 VITALS — BP 130/68 | HR 64 | Temp 98.1°F | Ht 65.0 in | Wt 153.0 lb

## 2017-09-08 DIAGNOSIS — Z23 Encounter for immunization: Secondary | ICD-10-CM

## 2017-09-08 DIAGNOSIS — Z0000 Encounter for general adult medical examination without abnormal findings: Secondary | ICD-10-CM

## 2017-09-08 DIAGNOSIS — R3912 Poor urinary stream: Secondary | ICD-10-CM

## 2017-09-08 MED ORDER — TAMSULOSIN HCL 0.4 MG PO CAPS: 0.4000 mg | ORAL_CAPSULE | Freq: Every day | ORAL | 3 refills | Status: DC

## 2017-09-08 NOTE — Patient Instructions (Addendum)
Return for care 2 months to follow-up on weak urine stream. Start Flomax 0.4 mg once daily.  When you return I will check your cholesterol, therefore please fast prior to visit.  Complete [facility] Financial Assistance Application     Tamsulosin capsules What is this medicine? TAMSULOSIN (tam SOO loe sin) is used to treat enlargement of the prostate gland in men, a condition called benign prostatic hyperplasia or BPH. It is not for use in women. It works by relaxing muscles in the prostate and bladder neck. This improves urine flow and reduces BPH symptoms. This medicine may be used for other purposes; ask your health care provider or pharmacist if you have questions. COMMON BRAND NAME(S): Flomax What should I tell my health care provider before I take this medicine? They need to know if you have any of the following conditions: -advanced kidney disease -advanced liver disease -low blood pressure -prostate cancer -an unusual or allergic reaction to tamsulosin, sulfa drugs, other medicines, foods, dyes, or preservatives -pregnant or trying to get pregnant -breast-feeding How should I use this medicine? Take this medicine by mouth about 30 minutes after the same meal every day. Follow the directions on the prescription label. Swallow the capsules whole with a glass of water. Do not crush, chew, or open capsules. Do not take your medicine more often than directed. Do not stop taking your medicine unless your doctor tells you to. Talk to your pediatrician regarding the use of this medicine in children. Special care may be needed. Overdosage: If you think you have taken too much of this medicine contact a poison control center or emergency room at once. NOTE: This medicine is only for you. Do not share this medicine with others. What if I miss a dose? If you miss a dose, take it as soon as you can. If it is almost time for your next dose, take only that dose. Do not take double or extra  doses. If you stop taking your medicine for several days or more, ask your doctor or health care professional what dose you should start back on. What may interact with this medicine? -cimetidine -fluoxetine -ketoconazole -medicines for erectile disfunction like sildenafil, tadalafil, vardenafil -medicines for high blood pressure -other alpha-blockers like alfuzosin, doxazosin, phentolamine, phenoxybenzamine, prazosin, terazosin -warfarin This list may not describe all possible interactions. Give your health care provider a list of all the medicines, herbs, non-prescription drugs, or dietary supplements you use. Also tell them if you smoke, drink alcohol, or use illegal drugs. Some items may interact with your medicine. What should I watch for while using this medicine? Visit your doctor or health care professional for regular check ups. You will need lab work done before you start this medicine and regularly while you are taking it. Check your blood pressure as directed. Ask your health care professional what your blood pressure should be, and when you should contact him or her. This medicine may make you feel dizzy or lightheaded. This is more likely to happen after the first dose, after an increase in dose, or during hot weather or exercise. Drinking alcohol and taking some medicines can make this worse. Do not drive, use machinery, or do anything that needs mental alertness until you know how this medicine affects you. Do not sit or stand up quickly. If you begin to feel dizzy, sit down until you feel better. These effects can decrease once your body adjusts to the medicine. Contact your doctor or health care professional right  away if you have an erection that lasts longer than 4 hours or if it becomes painful. This may be a sign of a serious problem and must be treated right away to prevent permanent damage. If you are thinking of having cataract surgery, tell your eye surgeon that you have taken  this medicine. What side effects may I notice from receiving this medicine? Side effects that you should report to your doctor or health care professional as soon as possible: -allergic reactions like skin rash or itching, hives, swelling of the lips, mouth, tongue, or throat -breathing problems -change in vision -feeling faint or lightheaded -irregular heartbeat -prolonged or painful erection -weakness Side effects that usually do not require medical attention (report to your doctor or health care professional if they continue or are bothersome): -back pain -change in sex drive or performance -constipation, nausea or vomiting -cough -drowsy -runny or stuffy nose -trouble sleeping This list may not describe all possible side effects. Call your doctor for medical advice about side effects. You may report side effects to FDA at 1-800-FDA-1088. Where should I keep my medicine? Keep out of the reach of children. Store at room temperature between 15 and 30 degrees C (59 and 86 degrees F). Throw away any unused medicine after the expiration date. NOTE: This sheet is a summary. It may not cover all possible information. If you have questions about this medicine, talk to your doctor, pharmacist, or health care provider.  2018 Elsevier/Gold Standard (2012-10-14 14:11:34)

## 2017-09-08 NOTE — Progress Notes (Signed)
Patient ID: Macario GoldsCleo W Odle, male    DOB: [birth year], 61 y.o.   MRN: [id number]  PCP: Bing NeighborsHarris, Cassidey Barrales S, FNP  Chief Complaint  Patient presents with  . Follow-up    6 month    Subjective:  HPI Cleo W Donnie CoffinRubin is a 61 y.o. male presents for a routine wellness follow-up. Medical problems significant for renal cyst.Cleo was previously referred to urology for evaluation of renal cyst and GI for colonoscopy, however, he never completed the financial assistance application. He continues to complain of urinary frequency and weak urine stream. He is not a diabetic and has maintained a normal PSA level. In the past he complained of on-going abdominal pain. CT of abdomen revealed the presence of a renal cyst only. Today, he reports complete resolution of abdominal symptoms. Cleo has recently retired and admits to increased cigarette smoking due to boredom. He admits to sedentary lifestyle and frequent naps and snacking throughout the day. Cleo denies shortness of breath, chest pain, dizziness, or headaches.  Social History   Socioeconomic History  . Marital status: Married    Spouse name: Not on file  . Number of children: Not on file  . Years of education: Not on file  . Highest education level: Not on file  Social Needs  . Financial resource strain: Not on file  . Food insecurity - worry: Not on file  . Food insecurity - inability: Not on file  . Transportation needs - medical: Not on file  . Transportation needs - non-medical: Not on file  Occupational History  . Not on file  Tobacco Use  . Smoking status: Current Every Day Smoker    Packs/day: 0.50  . Smokeless tobacco: Never Used  Substance and Sexual Activity  . Alcohol use: Yes    Alcohol/week: 4.8 oz    Types: 8 Cans of beer per week    Comment: occ  . Drug use: Yes    Types: Marijuana, Cocaine    Comment: last use 12/2014  . Sexual activity: Not on file  Other Topics Concern  . Not on file  Social History Narrative  . Not on  file    Family History  Problem Relation Age of Onset  . Diabetes Mother   . Hypertension Mother    Review of Systems  Constitutional: Negative for fever, chills, diaphoresis, activity change, appetite change and fatigue. HENT: Negative for ear pain, nosebleeds, congestion, facial swelling, rhinorrhea, neck pain, neck stiffness and ear discharge.  Eyes: Negative for pain, discharge, redness, itching and visual disturbance. Respiratory: Negative for cough, choking, chest tightness, shortness of breath, wheezing and stridor.  Cardiovascular: Negative for chest pain, palpitations and leg swelling. Gastrointestinal: Negative for abdominal distention. Genitourinary: Negative for dysuria, urgency, frequency, hematuria, flank pain, decreased urine volume, difficulty urinating and dyspareunia.  Musculoskeletal: Negative for back pain, joint swelling, arthralgia and gait problem. Neurological: Negative for dizziness, tremors, seizures, syncope, facial asymmetry, speech difficulty, weakness, light-headedness, numbness and headaches.  Hematological: Negative for adenopathy. Does not bruise/bleed easily. Psychiatric/Behavioral: Negative for hallucinations, behavioral problems, confusion, dysphoric mood, decreased concentration and agitation.   No Known Allergies  Prior to Admission medications   Medication Sig Start Date End Date Taking? Authorizing Provider  hydrocortisone 2.5 % cream Apply topically 2 (two) times daily. 04/26/17  Yes Kirichenko, Tatyana, PA-C  lidocaine (LMX) 4 % cream Apply 1 application topically as needed. 04/26/17  Yes Kirichenko, Tatyana, PA-C  Multiple Vitamin (MULTIVITAMIN WITH MINERALS) TABS tablet Take 1 tablet by mouth  daily.   Yes [provider]  omeprazole (PRILOSEC) 20 MG capsule Take 1 capsule (20 mg total) by mouth 2 (two) times daily before a meal. 01/31/17  Yes Bing NeighborsHarris, Isahi Godwin S, FNP  traMADol (ULTRAM) 50 MG tablet Take 1 tablet (50 mg total) by mouth every  6 (six) hours as needed. 01/14/15  Yes Santiago GladLaisure, Heather, PA-C    Past Medical, Surgical Family and Social History reviewed and updated.    Objective:   Today's Vitals   09/08/17 1519  BP: 130/68  Pulse: 64  Temp: 98.1 F (36.7 C)  TempSrc: Oral  SpO2: 100%  Weight: 153 lb (69.4 kg)  Height: 5\' 5"  (1.651 m)    Wt Readings from Last 3 Encounters:  09/08/17 153 lb (69.4 kg)  04/26/17 146 lb (66.2 kg)  03/03/17 148 lb (67.1 kg)   Physical Exam Constitutional: Patient appears well-developed and well-nourished. No distress. HENT: Normocephalic, atraumatic, External right and left ear normal. Oropharynx is clear and moist.  Eyes: Conjunctivae and EOM are normal. PERRLA, no scleral icterus. Neck: Normal ROM. Neck supple. No JVD. No tracheal deviation. No thyromegaly. CVS: RRR, S1/S2 +, no murmurs, no gallops, no carotid bruit.  Pulmonary: Effort and breath sounds normal, no stridor, rhonchi, wheezes, rales.  Abdominal: Soft. BS +, no distension, tenderness, rebound or guarding.  Musculoskeletal: Normal range of motion. No edema and no tenderness.  Lymphadenopathy: No lymphadenopathy noted, cervical, inguinal or axillary Neuro: Alert. Normal reflexes, muscle tone coordination. No cranial nerve deficit. Skin: Skin is warm and dry. No rash noted. Not diaphoretic. No erythema. No pallor. Psychiatric: Normal mood and affect. Behavior, judgment, thought content normal. Assessment & Plan:  1. Normal physical exam, routine -Age appropriate anticipatory guidance provided  -Smoking cessation encouraged  -Physical activity recommended to maintain optimal cardiovascular health -Colonoscopy is needed. [facility] in order to be referred to GI and urology.   2. Weak urine stream, no history of BPH, normal PSA level. Will trial Tamulosin 0.4 mg once daily to improve velocity of urine stream.  3. Need for immunization against influenza- Flu  Vaccine QUAD 36+ mos IM  RTC: 2 months to evaluate weak urine stream and urinary frequency.   Godfrey PickKimberly S. Tiburcio PeaHarris, MSN, FNP-C [facility]  [address] [place], [place] [address] [phone]

## 2017-09-17 ENCOUNTER — Emergency Department (HOSPITAL_COMMUNITY)
Admission: EM | Admit: 2017-09-17 | Discharge: 2017-09-17 | Disposition: A | Discharge status: Home / Self Care | Payer: Self-pay | Attending: Emergency Medicine | Admitting: Emergency Medicine

## 2017-09-17 ENCOUNTER — Emergency Department (HOSPITAL_COMMUNITY): Payer: Self-pay

## 2017-09-17 ENCOUNTER — Encounter (HOSPITAL_COMMUNITY): Payer: Self-pay | Admitting: Emergency Medicine

## 2017-09-17 DIAGNOSIS — R200 Anesthesia of skin: Secondary | ICD-10-CM | POA: Insufficient documentation

## 2017-09-17 DIAGNOSIS — R202 Paresthesia of skin: Secondary | ICD-10-CM | POA: Insufficient documentation

## 2017-09-17 DIAGNOSIS — Z79899 Other long term (current) drug therapy: Secondary | ICD-10-CM | POA: Insufficient documentation

## 2017-09-17 DIAGNOSIS — F17200 Nicotine dependence, unspecified, uncomplicated: Secondary | ICD-10-CM | POA: Insufficient documentation

## 2017-09-17 NOTE — ED Provider Notes (Signed)
[facility]-EMERGENCY DEPT Provider Note   CSN: [id number] Arrival date & time: 09/17/17  1229     History   Chief Complaint Chief Complaint  Patient presents with  . tingling in hand    left    HPI Scott Tate is a 61 y.o. male who presents to the ED with tingling to the left hand. The symptoms started over a week ago. Patient has tried multiple home remedies to try and resolve the problem but nothing has worked. Patient does not remember any injury to the area and is retired and has not been doing any repetitive motion with his hand.  Patient denies chest pain, n/v, shortness of breath or any other problems.  HPI  History reviewed. No pertinent past medical history.  There are no active problems to display for this patient.   Past Surgical History:  Procedure Laterality Date  . 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 KIDNEY DONATION    . PANCREATECTOMY         Home Medications    Prior to Admission medications   Medication Sig Start Date End Date Taking? Authorizing Provider  hydrocortisone 2.5 % cream Apply topically 2 (two) times daily. 04/26/17   Kirichenko, Tatyana, PA-C  lidocaine (LMX) 4 % cream Apply 1 application topically as needed. 04/26/17   Kirichenko, Lemont Fillersatyana, PA-C  Multiple Vitamin (MULTIVITAMIN WITH MINERALS) TABS tablet Take 1 tablet by mouth daily.    [provider]  omeprazole (PRILOSEC) 20 MG capsule Take 1 capsule (20 mg total) by mouth 2 (two) times daily before a meal. 01/31/17   Bing NeighborsHarris, Kimberly S, FNP  tamsulosin (FLOMAX) 0.4 MG CAPS capsule Take 1 capsule (0.4 mg total) daily by mouth. 09/08/17   Bing NeighborsHarris, Kimberly S, FNP  traMADol (ULTRAM) 50 MG tablet Take 1 tablet (50 mg total) by mouth every 6 (six) hours as needed. 01/14/15   Santiago GladLaisure, Heather, PA-C    Family History Family History  Problem Relation Age of Onset  . Diabetes Mother   . Hypertension Mother     Social History Social History    Tobacco Use  . Smoking status: Current Every Day Smoker    Packs/day: 0.50  . Smokeless tobacco: Never Used  Substance Use Topics  . Alcohol use: Yes    Alcohol/week: 4.8 oz    Types: 8 Cans of beer per week    Comment: occ  . Drug use: Yes    Types: Marijuana, Cocaine    Comment: last use 12/2014     Allergies   Patient has no known allergies.   Review of Systems Review of Systems  Constitutional: Negative for chills and fever.  HENT: Negative.   Respiratory: Negative for cough, chest tightness and shortness of breath.   Cardiovascular: Negative for chest pain and leg swelling.  Gastrointestinal: Negative for abdominal pain, nausea and vomiting.  Musculoskeletal: Negative for arthralgias and joint swelling.  Skin: Negative for wound.  Neurological: Negative for dizziness, weakness, light-headedness and headaches.       Tingling left hand  Psychiatric/Behavioral: Negative for confusion.     Physical Exam Updated Vital Signs BP (!) 147/73 (BP Location: Left Arm)   Pulse 64   Temp 98.2 F (36.8 C) (Oral)   Resp 17   Ht 5\' 5"  (1.651 m)   Wt 71.7 kg (158 lb)   BMI 26.29 kg/m   Physical Exam  Constitutional: He appears well-developed and well-nourished. No distress.  HENT:  Head: Normocephalic.  Eyes: EOM are normal.  Neck: Neck supple.  Cardiovascular: Normal rate.  Pulmonary/Chest: Effort normal.  Musculoskeletal: Normal range of motion.       Left hand: He exhibits normal range of motion, no tenderness, normal capillary refill, no deformity, no laceration and no swelling. Normal sensation noted. Normal strength noted. He exhibits no thumb/finger opposition.  Radial pulses 2+, adequate circulation. Grips are equal.  Neurological: He is alert.  Skin: Skin is warm and dry.  Psychiatric: He has a normal mood and affect. His behavior is normal.  Nursing note and vitals reviewed.    ED Treatments / Results  Labs (all labs ordered are listed, but only  abnormal results are displayed) Labs Reviewed - No data to display  Radiology Dg Hand Complete Left  Result Date: 09/17/2017 CLINICAL DATA:  Generalized hand pain for 1 week, no known injury, initial encounter EXAM: LEFT HAND - COMPLETE 3+ VIEW COMPARISON:  None. FINDINGS: No acute fracture or dislocation is noted. A tiny density is noted adjacent to the fifth distal phalanx along the dorsal aspect. This may be related prior trauma or previous foreign body. Clinical correlation is recommended. No other focal abnormality is noted. IMPRESSION: Small density adjacent to the fifth distal phalanx as described. No other focal abnormality is noted. Electronically Signed   By: Alcide CleverMark  Lukens M.D.   On: 09/17/2017 13:40    Procedures Procedures (including critical care time)  Medications Ordered in ED Medications - No data to display  I discussed this case with Dr. Donnald GarrePfeiffer.   Initial Impression / Assessment and Plan / ED Course  I have reviewed the triage vital signs and the nursing notes. 61 y.o. male with tingling of the fingers of the left hand stable for d/c without decreased strength. He has adequate circulation and full range of motion. Discussed with the patient need for f/u with PCP to possibly schedule nerve conduction study. Patient agrees with plan. Return precautions discussed.   Final Clinical Impressions(s) / ED Diagnoses   Final diagnoses:  Numbness and tingling in left hand    ED Discharge Orders    None       Kerrie Buffaloeese, Carmelia Tiner Lewis[place], [place]NP 09/17/17 1819    Arby BarrettePfeiffer, Marcy, MD 09/24/17 0004

## 2017-09-17 NOTE — ED Triage Notes (Signed)
Patient c/o left had tingling over week. Reports constant. Patient tried many remedies but doesn't help. Patient has full grip and no neuro deficits.

## 2017-09-17 NOTE — Discharge Instructions (Signed)
We did not find a reason for your tingling in your hand today. We are applying a wrist splint and you will need to follow up with your doctor and possible be scheduled for a nerve conduction study.

## 2017-11-10 ENCOUNTER — Ambulatory Visit: Payer: Self-pay | Admitting: Family Medicine

## 2018-01-21 ENCOUNTER — Ambulatory Visit: Payer: Self-pay

## 2018-03-02 ENCOUNTER — Other Ambulatory Visit: Payer: Self-pay | Admitting: Family Medicine

## 2018-03-02 DIAGNOSIS — R1084 Generalized abdominal pain: Secondary | ICD-10-CM

## 2018-03-05 ENCOUNTER — Other Ambulatory Visit: Payer: Self-pay

## 2018-03-05 ENCOUNTER — Encounter (HOSPITAL_COMMUNITY): Payer: Self-pay | Admitting: Emergency Medicine

## 2018-03-05 ENCOUNTER — Emergency Department (HOSPITAL_COMMUNITY)
Admission: EM | Admit: 2018-03-05 | Discharge: 2018-03-05 | Disposition: A | Discharge status: Home / Self Care | Payer: Self-pay | Attending: Emergency Medicine | Admitting: Emergency Medicine

## 2018-03-05 DIAGNOSIS — Z79899 Other long term (current) drug therapy: Secondary | ICD-10-CM | POA: Insufficient documentation

## 2018-03-05 DIAGNOSIS — G4700 Insomnia, unspecified: Secondary | ICD-10-CM | POA: Insufficient documentation

## 2018-03-05 DIAGNOSIS — F419 Anxiety disorder, unspecified: Secondary | ICD-10-CM | POA: Insufficient documentation

## 2018-03-05 DIAGNOSIS — F17210 Nicotine dependence, cigarettes, uncomplicated: Secondary | ICD-10-CM | POA: Insufficient documentation

## 2018-03-05 DIAGNOSIS — F430 Acute stress reaction: Secondary | ICD-10-CM | POA: Insufficient documentation

## 2018-03-05 DIAGNOSIS — F4321 Adjustment disorder with depressed mood: Secondary | ICD-10-CM

## 2018-03-05 MED ORDER — HYDROXYZINE HCL 25 MG PO TABS: 50.0000 mg | ORAL_TABLET | Freq: Every day | ORAL | Status: DC

## 2018-03-05 MED ORDER — HYDROXYZINE HCL 25 MG PO TABS: 25.0000 mg | ORAL_TABLET | Freq: Four times a day (QID) | ORAL | 0 refills | Status: DC

## 2018-03-05 NOTE — ED Notes (Signed)
Bed: [facility] Expected date:  Expected time:  Means of arrival:  Comments: 

## 2018-03-05 NOTE — ED Provider Notes (Signed)
[facility]-EMERGENCY DEPT Provider Note   CSN: [id number] Arrival date & time: 03/05/18  1306     History   Chief Complaint Chief Complaint  Patient presents with  . Insomnia  . Anxiety    HPI Scott Tate is a 62 y.o. male.  62 year old male without significant prior medical history presents with complaint of anxiety and insomnia.  Patient reportedly found his sister after she committed suicide approximately 2 weeks prior.  This was very traumatic to the patient.  Ever since the incident the patient has had difficulty sleeping.  The family and the patient are concerned that he is not handling his grief very well.  Patient denies any suicidal ideation.  Patient denies prior episodes of depression or psychiatric illness.  Patient is not taking anything for his symptoms.  He has not yet seen any outpatient counseling services or contacted his primary care provider for the same.  The history is provided by the patient and a relative.  Mental Health Problem  Presenting symptoms comment:  Anxiety and insomnia  Patient accompanied by:  Family member Degree of incapacity (severity):  Mild Onset quality:  Gradual Duration:  2 weeks Timing:  Constant Progression:  Waxing and waning Chronicity:  New Context: stressful life event   Context: not alcohol use and not drug abuse   Treatment compliance:  Untreated Relieved by:  Nothing Worsened by:  Nothing   History reviewed. No pertinent past medical history.  There are no active problems to display for this patient.   Past Surgical History:  Procedure Laterality Date  . 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 KIDNEY DONATION    . PANCREATECTOMY          Home Medications    Prior to Admission medications   Medication Sig Start Date End Date Taking? Authorizing Provider  tamsulosin (FLOMAX) 0.4 MG CAPS capsule Take 1 capsule (0.4 mg total) daily by mouth. 09/08/17  Yes Bing Neighbors, FNP   omeprazole (PRILOSEC) 20 MG capsule TAKE 1 CAPSULE BY MOUTH 2 TIMES DAILY BEFORE A MEAL. Patient not taking: Reported on 03/05/2018 03/02/18   Quentin Angst, MD    Family History Family History  Problem Relation Age of Onset  . Diabetes Mother   . Hypertension Mother     Social History Social History   Tobacco Use  . Smoking status: Current Every Day Smoker    Packs/day: 0.50  . Smokeless tobacco: Never Used  Substance Use Topics  . Alcohol use: Yes    Alcohol/week: 4.8 oz    Types: 8 Cans of beer per week    Comment: occ  . Drug use: Yes    Types: Marijuana, Cocaine    Comment: last use 12/2014     Allergies   Patient has no known allergies.   Review of Systems Review of Systems  Psychiatric/Behavioral:       Anxiety and insomnia   All other systems reviewed and are negative.    Physical Exam Updated Vital Signs BP (!) 159/76 (BP Location: Right Arm)   Pulse 72   Temp 98.6 F (37 C) (Oral)   Resp 19   SpO2 100%   Physical Exam  Constitutional: He is oriented to person, place, and time. He appears well-developed and well-nourished. No distress.  HENT:  Head: Normocephalic and atraumatic.  Mouth/Throat: Oropharynx is clear and moist.  Eyes: Pupils are equal, round, and reactive to light. Conjunctivae and  EOM are normal.  Neck: Normal range of motion. Neck supple.  Cardiovascular: Normal rate, regular rhythm and normal heart sounds.  Pulmonary/Chest: Effort normal and breath sounds normal. No respiratory distress.  Abdominal: Soft. He exhibits no distension. There is no tenderness.  Musculoskeletal: Normal range of motion. He exhibits no edema or deformity.  Neurological: He is alert and oriented to person, place, and time.  Skin: Skin is warm and dry.  Flat affect  Denies SI / HI   Psychiatric: He has a normal mood and affect.  Nursing note and vitals reviewed.    ED Treatments / Results  Labs (all labs ordered are listed, but only abnormal  results are displayed) Labs Reviewed - No data to display  EKG None  Radiology No results found.  Procedures Procedures (including critical care time)  Medications Ordered in ED Medications  hydrOXYzine (ATARAX/VISTARIL) tablet 50 mg (has no administration in time range)     Initial Impression / Assessment and Plan / ED Course  I have reviewed the triage vital signs and the nursing notes.  Pertinent labs & imaging results that were available during my care of the patient were reviewed by me and considered in my medical decision making (see chart for details).     1515 TTS aware of case - will evaluate patient in ED for outpatient counseling   MDM   Patient is presenting for evaluation of anxiety and insomnia following the discovery of his deceased sister.  Patient appears to be having a significant grief reaction.  Patient is not suicidal or threat to himself or others.  The patient would undoubtedly benefit from outpatient counseling.  Behavioral health has evaluated the patient and will assist with obtaining outpatient counseling. Will trial vistaril for sleep aid.    Final Clinical Impressions(s) / ED Diagnoses   Final diagnoses:  Insomnia, unspecified type  Grief reaction    ED Discharge Orders        Ordered    hydrOXYzine (ATARAX/VISTARIL) 25 MG tablet  Every 6 hours     03/05/18 1641       Wynetta Fines, MD 03/05/18 1642

## 2018-03-05 NOTE — ED Triage Notes (Signed)
Patient brought in by sister with complaints of anxiety. States that his sister committed suicide 2 weeks ago and he is the one who found her. Having trouble dealing with the situation. Difficulty eating and sleeping.

## 2018-03-05 NOTE — Discharge Instructions (Addendum)
Please return for any problem. Followup with outpatient counseling services as instructed.

## 2018-04-19 ENCOUNTER — Emergency Department (HOSPITAL_COMMUNITY): Payer: Self-pay

## 2018-04-19 ENCOUNTER — Emergency Department (HOSPITAL_COMMUNITY)
Admission: EM | Admit: 2018-04-19 | Discharge: 2018-04-19 | Disposition: A | Discharge status: Home / Self Care | Payer: Self-pay | Attending: Emergency Medicine | Admitting: Emergency Medicine

## 2018-04-19 ENCOUNTER — Other Ambulatory Visit: Payer: Self-pay

## 2018-04-19 ENCOUNTER — Encounter (HOSPITAL_COMMUNITY): Payer: Self-pay | Admitting: Emergency Medicine

## 2018-04-19 DIAGNOSIS — R1033 Periumbilical pain: Secondary | ICD-10-CM | POA: Insufficient documentation

## 2018-04-19 DIAGNOSIS — F17200 Nicotine dependence, unspecified, uncomplicated: Secondary | ICD-10-CM | POA: Insufficient documentation

## 2018-04-19 DIAGNOSIS — R1010 Upper abdominal pain, unspecified: Secondary | ICD-10-CM | POA: Insufficient documentation

## 2018-04-19 LAB — URINALYSIS, ROUTINE W REFLEX MICROSCOPIC
BILIRUBIN URINE: NEGATIVE
GLUCOSE, UA: NEGATIVE mg/dL
Hgb urine dipstick: NEGATIVE
Ketones, ur: NEGATIVE mg/dL
Leukocytes, UA: NEGATIVE
Nitrite: NEGATIVE
PH: 5 (ref 5.0–8.0)
Protein, ur: NEGATIVE mg/dL
SPECIFIC GRAVITY, URINE: 1.016 (ref 1.005–1.030)

## 2018-04-19 LAB — COMPREHENSIVE METABOLIC PANEL
ALT: 17 U/L (ref 17–63)
AST: 21 U/L (ref 15–41)
Albumin: 3.7 g/dL (ref 3.5–5.0)
Alkaline Phosphatase: 50 U/L (ref 38–126)
Anion gap: 8 (ref 5–15)
BILIRUBIN TOTAL: 1.3 mg/dL — AB (ref 0.3–1.2)
BUN: 11 mg/dL (ref 6–20)
CALCIUM: 9.1 mg/dL (ref 8.9–10.3)
CHLORIDE: 108 mmol/L (ref 101–111)
CO2: 23 mmol/L (ref 22–32)
Creatinine, Ser: 1.21 mg/dL (ref 0.61–1.24)
GLUCOSE: 133 mg/dL — AB (ref 65–99)
Potassium: 3.9 mmol/L (ref 3.5–5.1)
Sodium: 139 mmol/L (ref 135–145)
Total Protein: 7 g/dL (ref 6.5–8.1)

## 2018-04-19 LAB — LIPASE, BLOOD: Lipase: 23 U/L (ref 11–51)

## 2018-04-19 LAB — CBC
HCT: 41.6 % (ref 39.0–52.0)
HEMOGLOBIN: 14 g/dL (ref 13.0–17.0)
MCH: 30 pg (ref 26.0–34.0)
MCHC: 33.7 g/dL (ref 30.0–36.0)
MCV: 89.3 fL (ref 78.0–100.0)
Platelets: 265 10*3/uL (ref 150–400)
RBC: 4.66 MIL/uL (ref 4.22–5.81)
RDW: 13.2 % (ref 11.5–15.5)
WBC: 7.5 10*3/uL (ref 4.0–10.5)

## 2018-04-19 MED ORDER — SODIUM CHLORIDE 0.9 % IV BOLUS
500.0000 mL | Freq: Once | INTRAVENOUS | Status: AC
  Administered 2018-04-19: 500 mL via INTRAVENOUS

## 2018-04-19 MED ORDER — IOPAMIDOL (ISOVUE-300) INJECTION 61%
100.0000 mL | Freq: Once | INTRAVENOUS | Status: AC | PRN
  Administered 2018-04-19: 100 mL via INTRAVENOUS

## 2018-04-19 MED ORDER — SUCRALFATE 1 GM/10ML PO SUSP: 1.0000 g | Freq: Three times a day (TID) | ORAL | 0 refills | Status: DC

## 2018-04-19 MED ORDER — GI COCKTAIL ~~LOC~~
30.0000 mL | Freq: Once | ORAL | Status: AC
  Administered 2018-04-19: 30 mL via ORAL
  Filled 2018-04-19: qty 30

## 2018-04-19 MED ORDER — IOPAMIDOL (ISOVUE-300) INJECTION 61%
INTRAVENOUS | Status: AC
  Filled 2018-04-19: qty 100

## 2018-04-19 NOTE — ED Notes (Signed)
Ultrasound at bedside

## 2018-04-19 NOTE — ED Provider Notes (Signed)
[facility]-EMERGENCY DEPT Provider Note   CSN: [id number] Arrival date & time: 04/19/18  1139     History   Chief Complaint Chief Complaint  Patient presents with  . Abdominal Pain    HPI Scott Tate is a 62 y.o. male.  With a past medical history of kidney donation, pancreatectomy, alcohol abuse, drug abuse who presents emergency department today for intermittent abdominal pain over the last several months.  Patient reports that he is been having intermittent abdominal pain over last 2-3 months.  He reports that the pain is typically periumbilical and occurs approximate 1 hour after eating.  He describes the pain as a burning sensation.  He notes that the pain typically lasts 1-2 hours and is relieved after taking Zantac or Pepto-Bismol.  He notes however that the pain over the last 1 week has worsened and is no longer improving with those medications.  He notes that he took Pepto-Bismol this morning and had no improvement which is why he is presenting today. The patient denies any fever, chills, chest pain, sob, cough, RUQ, LLQ or RLQ abdominal pain, urinary symptoms, flank pain, N/V/D, melena, hematochezia, painful bowel movements, or any other symptoms at this time. Patient last bowel movement last night and normal.  He does report alcohol use and chronic nsaid use. He states that other then nephrectomy he has never had previous abdominal surgery. He is still passing gas.   HPI  History reviewed. No pertinent past medical history.  There are no active problems to display for this patient.   Past Surgical History:  Procedure Laterality Date  . 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 KIDNEY DONATION    . PANCREATECTOMY          Home Medications    Prior to Admission medications   Medication Sig Start Date End Date Taking? Authorizing Provider  bismuth subsalicylate (PEPTO BISMOL) 262 MG/15ML suspension Take 30 mLs by mouth every 6 (six) hours  as needed for indigestion.   Yes [provider]  hydrOXYzine (ATARAX/VISTARIL) 25 MG tablet Take 1 tablet (25 mg total) by mouth every 6 (six) hours. Patient taking differently: Take 25 mg by mouth every 6 (six) hours as needed for anxiety.  03/05/18  Yes Wynetta Fines, MD  omeprazole (PRILOSEC) 20 MG capsule TAKE 1 CAPSULE BY MOUTH 2 TIMES DAILY BEFORE A MEAL. Patient taking differently: TAKE 1 CAPSULE BY MOUTH 2 TIMES DAILY PRN FOR INDIGESTION BEFORE A MEAL. 03/02/18  Yes Jegede, Olugbemiga E, MD  ranitidine (ZANTAC) 150 MG tablet Take 150 mg by mouth 2 (two) times daily as needed for heartburn.   Yes [provider]  tamsulosin (FLOMAX) 0.4 MG CAPS capsule Take 1 capsule (0.4 mg total) daily by mouth. Patient not taking: Reported on 04/19/2018 09/08/17   Bing Neighbors, FNP    Family History Family History  Problem Relation Age of Onset  . Diabetes Mother   . Hypertension Mother     Social History Social History   Tobacco Use  . Smoking status: Current Every Day Smoker    Packs/day: 0.50  . Smokeless tobacco: Never Used  Substance Use Topics  . Alcohol use: Yes    Alcohol/week: 4.8 oz    Types: 8 Cans of beer per week    Comment: occ  . Drug use: Yes    Types: Marijuana, Cocaine    Comment: last use 12/2014     Allergies  Patient has no known allergies.   Review of Systems Review of Systems  All other systems reviewed and are negative.    Physical Exam Updated Vital Signs BP 129/72 (BP Location: Left Arm)   Pulse 64   Temp 97.9 F (36.6 C) (Oral)   Resp 16   Ht 5\' 5"  (1.651 m)   Wt 71.7 kg (158 lb)   SpO2 98%   BMI 26.29 kg/m   Physical Exam  Constitutional: He appears well-developed and well-nourished.  HENT:  Head: Normocephalic and atraumatic.  Right Ear: External ear normal.  Left Ear: External ear normal.  Nose: Nose normal.  Mouth/Throat: Uvula is midline, oropharynx is clear and moist and mucous membranes are normal. No  tonsillar exudate.  Eyes: Pupils are equal, round, and reactive to light. Right eye exhibits no discharge. Left eye exhibits no discharge. No scleral icterus.  Neck: Trachea normal. Neck supple. No spinous process tenderness present. No neck rigidity. Normal range of motion present.  Cardiovascular: Normal rate, regular rhythm and intact distal pulses.  No murmur heard. Pulses:      Radial pulses are 2+ on the right side, and 2+ on the left side.       Dorsalis pedis pulses are 2+ on the right side, and 2+ on the left side.       Posterior tibial pulses are 2+ on the right side, and 2+ on the left side.  No lower extremity swelling or edema. Calves symmetric in size bilaterally.  Pulmonary/Chest: Effort normal and breath sounds normal. He exhibits no tenderness.  Abdominal: Soft. Bowel sounds are normal. He exhibits no distension. There is tenderness in the periumbilical area. There is no rigidity, no rebound, no guarding and no CVA tenderness.  Musculoskeletal: He exhibits no edema.  Lymphadenopathy:    He has no cervical adenopathy.  Neurological: He is alert.  Skin: Skin is warm and dry. No rash noted. He is not diaphoretic.  Psychiatric: He has a normal mood and affect.  Nursing note and vitals reviewed.    ED Treatments / Results  Labs (all labs ordered are listed, but only abnormal results are displayed) Labs Reviewed  COMPREHENSIVE METABOLIC PANEL - Abnormal; Notable for the following components:      Result Value   Glucose, Bld 133 (*)    Total Bilirubin 1.3 (*)    All other components within normal limits  LIPASE, BLOOD  CBC  URINALYSIS, ROUTINE W REFLEX MICROSCOPIC    EKG None  Radiology Ct Abdomen Pelvis W Contrast  Result Date: 04/19/2018 CLINICAL DATA:  Intermittent generalized abdominal pain. EXAM: CT ABDOMEN AND PELVIS WITH CONTRAST TECHNIQUE: Multidetector CT imaging of the abdomen and pelvis was performed using the standard protocol following bolus  administration of intravenous contrast. CONTRAST:  ISOVUE-300 IOPAMIDOL (ISOVUE-300) INJECTION 61% COMPARISON:  02/06/2017 FINDINGS: Lower chest: No acute abnormality. Hepatobiliary: 2.1 cm cyst, segment 6. Two small cysts noted in the medial segment of the left lobe. Small lesion in the lateral segment of the left lobe, which is likely a hemangioma. These lesions are stable from the prior CT. No other liver abnormality. Status post cholecystectomy. No bile duct dilation. Pancreas: Unremarkable. No pancreatic ductal dilatation or surrounding inflammatory changes. Spleen: Normal in size without focal abnormality. Adrenals/Urinary Tract: No adrenal masses. Status post left nephrectomy. 3.5 cm lower pole cyst. There are additional tiny low-density right renal lesions are likely also cysts. No other renal masses or lesions. No stones. No hydronephrosis. Right ureter is normal  course and in caliber. Bladder is unremarkable. Stomach/Bowel: Stomach and small bowel unremarkable. Mild generalized increased stool noted throughout the colon. No colonic wall thickening or inflammation. Normal appendix visualized. Vascular/Lymphatic: No significant vascular findings are present. No enlarged abdominal or pelvic lymph nodes. Reproductive: Mild prostatic enlargement. Other: No abdominal wall hernia or abnormality. No abdominopelvic ascites. Musculoskeletal: No fracture or acute finding. No osteoblastic or osteolytic lesions. Disc and facet degenerative changes at L3-L4 with a grade 1 anterolisthesis. IMPRESSION: 1. No acute findings within the abdomen or pelvis. 2. Mild increased stool noted throughout the colon. No bowel inflammation. 3. Status post left nephrectomy. 4. Small low-density liver lesions consistent with cysts. Small angioma in the lateral segment of the left lobe. Electronically Signed   By: Amie Portland M.D.   On: 04/19/2018 14:43   US Abdomen Limited Ruq  Result Date: 04/19/2018 CLINICAL DATA:  Upper  abdominal pain for 3 weeks. Prior cholecystectomy. EXAM: ULTRASOUND ABDOMEN LIMITED RIGHT UPPER QUADRANT COMPARISON:  CT abdomen and pelvis 04/19/2018 FINDINGS: Gallbladder: Surgically absent. Common bile duct: Diameter: 5 mm Liver: There is normal background parenchymal echogenicity. A 1.6 cm hyperechoic lesion is present in the left hepatic lobe and likely corresponds to the lesion with nodular enhancement on CT. A 2.1 cm cyst is noted in the right hepatic lobe. Portal vein is patent on color Doppler imaging with normal direction of blood flow towards the liver. A 3.1 cm right renal cyst is incidentally noted. IMPRESSION: 1. Status post cholecystectomy.  No biliary dilatation. 2. 1.6 cm echogenic left hepatic lobe lesion most compatible with a hemangioma given appearance on CT. 3. Hepatic and right renal cysts. Electronically Signed   By: Sebastian Ache M.D.   On: 04/19/2018 15:26    Procedures Procedures (including critical care time)  Medications Ordered in ED Medications  gi cocktail (Maalox,Lidocaine,Donnatal) (30 mLs Oral Given 04/19/18 1332)  iopamidol (ISOVUE-300) 61 % injection 100 mL (100 mLs Intravenous Contrast Given 04/19/18 1401)  sodium chloride 0.9 % bolus 500 mL (0 mLs Intravenous Stopped 04/19/18 1600)     Initial Impression / Assessment and Plan / ED Course  I have reviewed the triage vital signs and the nursing notes.  Pertinent labs & imaging results that were available during my care of the patient were reviewed by me and considered in my medical decision making (see chart for details).     62 year old male presenting with periumbilical abdominal pain that occurs after eating after the last several months. Denies fevers. Patient is nontoxic, nonseptic appearing, in no apparent distress.  Patient's pain and other symptoms adequately managed in emergency department.  Fluid bolus given.  Labs, imaging and vitals reviewed.  Patient does not meet the SIRS or Sepsis criteria.  On  repeat exam patient does not have a surgical abdomin and there are no peritoneal signs.  No indication of UTI, appendicitis, bowel obstruction, bowel perforation, cholecystitis (patient was unaware that he previously had his gallbladder removed), diverticulitis or other emergent process. Suspect gastritis. Patient is already on omeprazole. Will give Carafate. Symptoms improved with GI cocktail. Patient to be discharged home with symptomatic treatment and given strict instructions for follow-up with their primary care physician.  I have also discussed reasons to return immediately to the ER.  Patient expresses understanding and agrees with plan.  Final Clinical Impressions(s) / ED Diagnoses   Final diagnoses:  Upper abdominal pain  Periumbilical abdominal pain    ED Discharge Orders  Ordered    sucralfate (CARAFATE) 1 GM/10ML suspension  3 times daily with meals & bedtime     04/19/18 1547       Princella PellegriniMaczis, Edwardine Deschepper M, PA-C 04/21/18 0017    Arby BarrettePfeiffer, Marcy, MD 04/30/18 2236

## 2018-04-19 NOTE — Discharge Instructions (Signed)
Please read and follow all provided instructions You have been seen today for your complaint of: abdominal pain Your lab work, imaging and urinalysis were reassuring.  Vital signs: See below  Abdominal Pain  Your exam might not show the exact reason you have abdominal pain. Since there are many different causes of abdominal pain, another checkup and more tests may be needed. It is very important to follow up for lasting (persistent) or worsening symptoms. A possible cause of abdominal pain in any person who still has his or her appendix is acute appendicitis. Appendicitis is often hard to diagnose. Normal blood tests, urine tests, ultrasound, and CT scans do not completely rule out early appendicitis or other causes of abdominal pain. Sometimes, only the changes that happen over time will allow appendicitis and other causes of abdominal pain to be determined. Other potential problems that may require surgery may also take time to become more apparent. Because of this, it is important that you follow all of the instructions below.   HOME CARE INSTRUCTIONS  Do not take laxatives unless directed by your caregiver. Rest as much as possible.  Do not eat solid food until your pain is gone: A diet of water, weak decaffeinated tea, broth or bouillon, gelatin, oral rehydration solutions (ORS), frozen ice pops, or ice chips may be helpful.  When pain is gone: Start a light diet (dry toast, crackers, applesauce, or white rice). Increase the diet slowly as long as it does not bother you. Eat no dairy products (including cheese and eggs) and no spicy, fatty, fried, or high-fiber foods.  Use no alcohol, caffeine, or cigarettes.  Take your regular medicines unless your caregiver told you not to.  Take any prescribed medicine as directed.   SEEK IMMEDIATE MEDICAL CARE IF:  The pain does not go away.  You have a fever >101 that does not go down with medication. You keep throwing up (vomiting) or cannot drink  liquids.  You have shaking chills (rigors) The pain becomes localized (Pain in the right side could possibly be appendicitis. In an adult, pain in the left lower portion of the abdomen could be colitis or diverticulitis). You pass bloody or black tarry stools.  There is bright red blood in the stool.  There is blood in your vomit. Your bowel movements stop (become blocked) or you cannot pass gas.  The constipation stays for more than 4 days.  You have bloody, frequent, or painful urination.  You have yellow discoloration in the skin or whites of the eyes.  Your stomach becomes bloated or bigger.  You have dizziness or fainting.  You have chest or back pain. You have rectal pain.  You do not seem to be getting better.  You have any questions or concerns.   Your vital signs today were: BP 122/84   Pulse (!) 57   Temp 97.9 F (36.6 C) (Oral)   Resp 20   Ht 5\' 5"  (1.651 m)   Wt 71.7 kg (158 lb)   SpO2 100%   BMI 26.29 kg/m  If your blood pressure (bp) was elevated above 135/85 this visit, please have this repeated by your doctor within one month.

## 2018-04-19 NOTE — ED Notes (Signed)
Patient transported to CT 

## 2018-04-19 NOTE — ED Notes (Signed)
ED Provider at bedside. 

## 2018-04-19 NOTE — ED Notes (Signed)
Pharmacy tech at bedside 

## 2018-04-19 NOTE — ED Triage Notes (Signed)
Patient c/o intermittent generalized abdominal pain. Denies N/V/D. Reports pain improves with peptobismol.

## 2018-04-19 NOTE — ED Notes (Signed)
PT GIVEN WATER FOR URINE COLLECTION.

## 2018-08-22 ENCOUNTER — Other Ambulatory Visit: Payer: Self-pay

## 2018-08-22 ENCOUNTER — Encounter (HOSPITAL_COMMUNITY): Payer: Self-pay | Admitting: *Deleted

## 2018-08-22 ENCOUNTER — Emergency Department (HOSPITAL_COMMUNITY): Payer: Self-pay

## 2018-08-22 ENCOUNTER — Emergency Department (HOSPITAL_COMMUNITY)
Admission: EM | Admit: 2018-08-22 | Discharge: 2018-08-22 | Disposition: A | Discharge status: Home / Self Care | Payer: Self-pay | Attending: Emergency Medicine | Admitting: Emergency Medicine

## 2018-08-22 DIAGNOSIS — Z79899 Other long term (current) drug therapy: Secondary | ICD-10-CM | POA: Insufficient documentation

## 2018-08-22 DIAGNOSIS — R0781 Pleurodynia: Secondary | ICD-10-CM

## 2018-08-22 DIAGNOSIS — F17200 Nicotine dependence, unspecified, uncomplicated: Secondary | ICD-10-CM | POA: Insufficient documentation

## 2018-08-22 DIAGNOSIS — R0789 Other chest pain: Secondary | ICD-10-CM | POA: Insufficient documentation

## 2018-08-22 LAB — D-DIMER, QUANTITATIVE: D-Dimer, Quant: 0.35 ug/mL-FEU (ref 0.00–0.50)

## 2018-08-22 LAB — COMPREHENSIVE METABOLIC PANEL
ALK PHOS: 49 U/L (ref 38–126)
ALT: 14 U/L (ref 0–44)
AST: 18 U/L (ref 15–41)
Albumin: 3.7 g/dL (ref 3.5–5.0)
Anion gap: 5 (ref 5–15)
BUN: 15 mg/dL (ref 8–23)
CALCIUM: 9 mg/dL (ref 8.9–10.3)
CHLORIDE: 107 mmol/L (ref 98–111)
CO2: 27 mmol/L (ref 22–32)
CREATININE: 1.23 mg/dL (ref 0.61–1.24)
GFR calc Af Amer: >60 mL/min (ref >60)
Glucose, Bld: 91 mg/dL (ref 70–99)
Potassium: 4 mmol/L (ref 3.5–5.1)
Sodium: 139 mmol/L (ref 135–145)
Total Bilirubin: 0.7 mg/dL (ref 0.3–1.2)
Total Protein: 7.1 g/dL (ref 6.5–8.1)

## 2018-08-22 LAB — URINALYSIS, ROUTINE W REFLEX MICROSCOPIC
Bilirubin Urine: NEGATIVE
Glucose, UA: NEGATIVE mg/dL
Hgb urine dipstick: NEGATIVE
Ketones, ur: NEGATIVE mg/dL
Leukocytes, UA: NEGATIVE
Nitrite: NEGATIVE
Protein, ur: NEGATIVE mg/dL
SPECIFIC GRAVITY, URINE: 1.024 (ref 1.005–1.030)
pH: 6 (ref 5.0–8.0)

## 2018-08-22 LAB — CBC WITH DIFFERENTIAL/PLATELET
Abs Immature Granulocytes: 0.04 10*3/uL (ref 0.00–0.07)
Basophils Absolute: 0.1 10*3/uL (ref 0.0–0.1)
Basophils Relative: 1 %
Eosinophils Absolute: 0.3 10*3/uL (ref 0.0–0.5)
Eosinophils Relative: 3 %
HCT: 43.9 % (ref 39.0–52.0)
Hemoglobin: 13.9 g/dL (ref 13.0–17.0)
Immature Granulocytes: 0 %
Lymphocytes Relative: 17 %
Lymphs Abs: 2 10*3/uL (ref 0.7–4.0)
MCH: 28.4 pg (ref 26.0–34.0)
MCHC: 31.7 g/dL (ref 30.0–36.0)
MCV: 89.8 fL (ref 80.0–100.0)
Monocytes Absolute: 0.8 10*3/uL (ref 0.1–1.0)
Monocytes Relative: 7 %
Neutro Abs: 9 10*3/uL — ABNORMAL HIGH (ref 1.7–7.7)
Neutrophils Relative %: 72 %
Platelets: 284 10*3/uL (ref 150–400)
RBC: 4.89 MIL/uL (ref 4.22–5.81)
RDW: 12.9 % (ref 11.5–15.5)
WBC: 12.3 10*3/uL — ABNORMAL HIGH (ref 4.0–10.5)
nRBC: 0 % (ref 0.0–0.2)

## 2018-08-22 LAB — I-STAT TROPONIN, ED: Troponin i, poc: 0.01 ng/mL (ref 0.00–0.08)

## 2018-08-22 LAB — LIPASE, BLOOD: LIPASE: 32 U/L (ref 11–51)

## 2018-08-22 MED ORDER — METHOCARBAMOL 500 MG PO TABS
500.0000 mg | ORAL_TABLET | Freq: Once | ORAL | Status: AC
  Administered 2018-08-22: 500 mg via ORAL
  Filled 2018-08-22: qty 1

## 2018-08-22 MED ORDER — METHOCARBAMOL 500 MG PO TABS: 500.0000 mg | ORAL_TABLET | Freq: Two times a day (BID) | ORAL | 0 refills | Status: DC

## 2018-08-22 MED ORDER — OXYCODONE HCL 5 MG PO TABS
5.0000 mg | ORAL_TABLET | Freq: Once | ORAL | Status: AC
  Administered 2018-08-22: 5 mg via ORAL
  Filled 2018-08-22: qty 1

## 2018-08-22 MED ORDER — HYDROCODONE-ACETAMINOPHEN 5-325 MG PO TABS: 1.0000 | ORAL_TABLET | Freq: Four times a day (QID) | ORAL | 0 refills | Status: DC | PRN

## 2018-08-22 NOTE — ED Notes (Signed)
Urine culture sent down to lab with urinalysis. 

## 2018-08-22 NOTE — ED Notes (Signed)
Patient transported to X-ray 

## 2018-08-22 NOTE — ED Triage Notes (Signed)
Pt reports right lateral rib pain, goes up his back today, denies injury.

## 2018-08-22 NOTE — Discharge Instructions (Addendum)
1. Medications: robaxin, OTC naproxyn, vicodin, usual home medications 2. Treatment: rest, drink plenty of fluids, gentle stretching as discussed, alternate ice and heat 3. Follow Up: Please followup with your primary doctor in 2-3 days for discussion of your diagnoses and further evaluation after today's visit; if you do not have a primary care doctor use the resource guide provided to find one;  Return to the ER for worsening back pain, difficulty walking, loss of bowel or bladder control, development of abd pain, fever, chest pain, vomiting or other concerning symptoms

## 2018-08-22 NOTE — ED Notes (Signed)
Pt called out to nurse's station stating that pain is increasing and intensified when he breathes. Pt in NAD. Will notify PA.

## 2018-08-22 NOTE — ED Provider Notes (Signed)
[facility]-EMERGENCY DEPT Provider Note   CSN: [id number] Arrival date & time: 08/22/18  0005     History   Chief Complaint Chief Complaint  Patient presents with  . Rib Pain    HPI Scott Tate is a 62 y.o. 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  He denies new activities, lifting, falls or known trauma.  He denies chest pain, shortness of breath, abdominal pain, nausea, vomiting, fever, chills, cough.  He denies recent sick contacts or URI symptoms.  Patient denies rash.  He reports pain is deep and is not on the skin.  He has never had shingles.  Patient reports taking naproxen prior to arrival with some improvement.  Patient denies history of DVT/PE, periods of immobilization, history of lupus, leg swelling.  He denies lightheadedness, syncope, near syncope, numbness, tingling, weakness.  The history is provided by the patient and medical records. No language interpreter was used.    History reviewed. No pertinent past medical history.  There are no active problems to display for this patient.   Past Surgical History:  Procedure Laterality Date  . 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 KIDNEY DONATION    . PANCREATECTOMY          Home Medications    Prior to Admission medications   Medication Sig Start Date End Date Taking? Authorizing Provider  naproxen sodium (ALEVE) 220 MG tablet Take 220 mg by mouth 2 (two) times daily as needed (pain).   Yes [provider]  vortioxetine HBr (TRINTELLIX) 10 MG TABS tablet Take 10 mg by mouth daily.   Yes [provider]  HYDROcodone-acetaminophen (NORCO/VICODIN) 5-325 MG tablet Take 1 tablet by mouth every 6 (six) hours as needed. 08/22/18   Kalman Nylen, Dahlia Client, PA-C  hydrOXYzine (ATARAX/VISTARIL) 25  MG tablet Take 1 tablet (25 mg total) by mouth every 6 (six) hours. Patient not taking: Reported on 08/22/2018 03/05/18   Wynetta Fines, MD  methocarbamol (ROBAXIN) 500 MG tablet Take 1 tablet (500 mg total) by mouth 2 (two) times daily. 08/22/18   Mija Effertz, Dahlia Client, PA-C  omeprazole (PRILOSEC) 20 MG capsule TAKE 1 CAPSULE BY MOUTH 2 TIMES DAILY BEFORE A MEAL. Patient not taking: Reported on 08/22/2018 03/02/18   Quentin Angst, MD  sucralfate (CARAFATE) 1 GM/10ML suspension Take 10 mLs (1 g total) by mouth 4 (four) times daily -  with meals and at bedtime. Patient not taking: Reported on 08/22/2018 04/19/18   Maczis, Elmer Sow, PA-C  tamsulosin (FLOMAX) 0.4 MG CAPS capsule Take 1 capsule (0.4 mg total) daily by mouth. Patient not taking: Reported on 04/19/2018 09/08/17   Bing Neighbors, FNP    Family History Family History  Problem Relation Age of Onset  . Diabetes Mother   . Hypertension Mother     Social History Social History   Tobacco Use  . Smoking status: Current Every Day Smoker    Packs/day: 0.50  . Smokeless tobacco: Never Used  Substance Use Topics  . Alcohol use: Yes    Alcohol/week: 8.0 standard drinks    Types: 8 Cans of beer per week    Comment: occ  . Drug use: Yes    Types: Marijuana, Cocaine    Comment: last use 12/2014     Allergies   Patient has no  known allergies.   Review of Systems Review of Systems  Constitutional: Negative for appetite change, diaphoresis, fatigue, fever and unexpected weight change.  HENT: Negative for mouth sores.   Eyes: Negative for visual disturbance.  Respiratory: Negative for cough, chest tightness, shortness of breath and wheezing.   Cardiovascular: Negative for chest pain.  Gastrointestinal: Negative for abdominal pain, constipation, diarrhea, nausea and vomiting.  Endocrine: Negative for polydipsia, polyphagia and polyuria.  Genitourinary: Negative for dysuria, frequency, hematuria and urgency.    Musculoskeletal: Positive for back pain ( Right upper back/rib). Negative for neck stiffness.  Skin: Negative for rash.  Allergic/Immunologic: Negative for immunocompromised state.  Neurological: Negative for syncope, light-headedness and headaches.  Hematological: Does not bruise/bleed easily.  Psychiatric/Behavioral: Negative for sleep disturbance. The patient is not nervous/anxious.      Physical Exam Updated Vital Signs BP 123/67 (BP Location: Right Arm)   Pulse (!) 46   Temp 98.2 F (36.8 C) (Oral)   Resp (!) 23   Ht 5\' 6"  (1.676 m)   Wt 70.3 kg   SpO2 98%   BMI 25.02 kg/m   Physical Exam  Constitutional: He appears well-developed and well-nourished. No distress.  Awake, alert, nontoxic appearance  HENT:  Head: Normocephalic and atraumatic.  Mouth/Throat: Oropharynx is clear and moist. No oropharyngeal exudate.  Eyes: Conjunctivae are normal. No scleral icterus.  Neck: Normal range of motion. Neck supple.  Cardiovascular: Regular rhythm and intact distal pulses. Bradycardia present.  Pulses:      Radial pulses are 2+ on the right side, and 2+ on the left side.       Posterior tibial pulses are 2+ on the right side, and 2+ on the left side.  Pulmonary/Chest: Effort normal and breath sounds normal. No respiratory distress. He has no wheezes.  Equal chest expansion Clear and equal breath sounds  Abdominal: Soft. Bowel sounds are normal. He exhibits no mass. There is no tenderness. There is no rebound and no guarding.  No right-sided CVA tenderness  Musculoskeletal: Normal range of motion. He exhibits no edema.  No calf tenderness  Neurological: He is alert.  Speech is clear and goal oriented Moves extremities without ataxia  Skin: Skin is warm and dry. He is not diaphoretic.  No rash or hypersensitivity of the skin in the right flank, right chest or back.  Psychiatric: He has a normal mood and affect.  Nursing note and vitals reviewed.    ED Treatments / Results   Labs (all labs ordered are listed, but only abnormal results are displayed) Labs Reviewed  CBC WITH DIFFERENTIAL/PLATELET - Abnormal; Notable for the following components:      Result Value   WBC 12.3 (*)    Neutro Abs 9.0 (*)    All other components within normal limits  COMPREHENSIVE METABOLIC PANEL  LIPASE, BLOOD  D-DIMER, QUANTITATIVE (NOT AT [facility])  URINALYSIS, ROUTINE W REFLEX MICROSCOPIC  I-STAT TROPONIN, ED    EKG EKG Interpretation  Date/Time:  Saturday August 22 2018 01:56:30 EDT Ventricular Rate:  46 PR Interval:    QRS Duration: 102 QT Interval:  445 QTC Calculation: 390 R Axis:   51 Text Interpretation:  Sinus bradycardia Borderline ST elevation, lateral leads No significant change was found Confirmed by Paula Libra ([id number]) on 08/22/2018 2:09:11 AM   Radiology Dg Ribs Unilateral W/chest Right  Result Date: 08/22/2018 CLINICAL DATA:  Shortness of breath and RIGHT rib pain. EXAM: RIGHT RIBS AND CHEST - 3+ VIEW COMPARISON:  Chest radiograph Mar 27, 2016 FINDINGS: Cardiomediastinal silhouette is normal. No pleural effusions or focal consolidations. Strandy densities LEFT lung base. Trachea projects midline and there is no pneumothorax. Soft tissue planes and included osseous structures are non-suspicious. Mild acromioclavicular osteoarthrosis. Surgical clips in the included right abdomen compatible with cholecystectomy. IMPRESSION: LEFT lung base atelectasis. Electronically Signed   By: Awilda Metro M.D.   On: 08/22/2018 01:53    Procedures Procedures (including critical care time)  Medications Ordered in ED Medications  oxyCODONE (Oxy IR/ROXICODONE) immediate release tablet 5 mg (5 mg Oral Given 08/22/18 0112)  methocarbamol (ROBAXIN) tablet 500 mg (500 mg Oral Given 08/22/18 0324)     Initial Impression / Assessment and Plan / ED Course  I have reviewed the triage vital signs and the nursing notes.  Pertinent labs & imaging results that were  available during my care of the patient were reviewed by me and considered in my medical decision making (see chart for details).  Clinical Course as of Aug 22 657  Sat Aug 22, 2018  0322 [place] Narcotic Database was accessed.  In the last 12 months, pt has had 0 prescriptions for controlled substances in the last 12 months    [HM]    Clinical Course User Index [HM] Tanga Gloor, Boyd Kerbs    Presents with right-sided rib and upper back pain.  He is afebrile without tachycardia or hypotension.  He is mildly tachypneic but does not appear distressed.  No accessory muscle usage or retractions.  Clear and equal breath sounds on exam.  No hypoxia.  Labs are reassuring.  No hemoglobin in his urine or evidence of urinary tract infection to suggest pyelonephritis or renal colic.  Troponin is negative and EKG is without acute ischemia.  It is unchanged from previous.  No elevation in AST/ALT or lipase to suggest cholecystitis.  Abdomen is soft and nontender without right upper quadrant pain.  D-dimer is negative.  Patient is low risk.  Patient does have slightly elevated white blood cell count at 12.3.  Nonspecific leukocytosis.  Patient given Percocet and Robaxin here in the emergency department with some improvement in pain.  No rash or hypersensitivity of the skin.  Patient symptoms may be secondary to early shingles however no evidence of this today.  The patient was discussed with by Dr. Read Drivers who agrees with the treatment plan.   Final Clinical Impressions(s) / ED Diagnoses   Final diagnoses:  Rib pain on right side    ED Discharge Orders         Ordered    HYDROcodone-acetaminophen (NORCO/VICODIN) 5-325 MG tablet  Every 6 hours PRN     08/22/18 0323    methocarbamol (ROBAXIN) 500 MG tablet  2 times daily     08/22/18 0323           Chesley Valls, Dahlia Client, PA-C 08/22/18 0658    Molpus, Jonny Ruiz, MD 08/22/18 [phone]

## 2020-05-27 ENCOUNTER — Encounter (HOSPITAL_COMMUNITY): Payer: Self-pay | Admitting: Emergency Medicine

## 2020-05-27 ENCOUNTER — Emergency Department (HOSPITAL_BASED_OUTPATIENT_CLINIC_OR_DEPARTMENT_OTHER)
Admission: EM | Admit: 2020-05-27 | Discharge: 2020-05-28 | Disposition: A | Discharge status: Home / Self Care | Payer: Medicare HMO | Source: Home / Self Care | Attending: Emergency Medicine | Admitting: Emergency Medicine

## 2020-05-27 ENCOUNTER — Other Ambulatory Visit: Payer: Self-pay

## 2020-05-27 ENCOUNTER — Emergency Department (HOSPITAL_COMMUNITY)
Admission: EM | Admit: 2020-05-27 | Discharge: 2020-05-27 | Disposition: A | Discharge status: Home / Self Care | Payer: Medicare HMO | Attending: Emergency Medicine | Admitting: Emergency Medicine

## 2020-05-27 DIAGNOSIS — F17210 Nicotine dependence, cigarettes, uncomplicated: Secondary | ICD-10-CM | POA: Insufficient documentation

## 2020-05-27 DIAGNOSIS — Z79899 Other long term (current) drug therapy: Secondary | ICD-10-CM | POA: Insufficient documentation

## 2020-05-27 DIAGNOSIS — Z5321 Procedure and treatment not carried out due to patient leaving prior to being seen by health care provider: Secondary | ICD-10-CM | POA: Diagnosis not present

## 2020-05-27 DIAGNOSIS — T63441A Toxic effect of venom of bees, accidental (unintentional), initial encounter: Secondary | ICD-10-CM | POA: Insufficient documentation

## 2020-05-27 DIAGNOSIS — T63481A Toxic effect of venom of other arthropod, accidental (unintentional), initial encounter: Secondary | ICD-10-CM

## 2020-05-27 DIAGNOSIS — F1210 Cannabis abuse, uncomplicated: Secondary | ICD-10-CM | POA: Insufficient documentation

## 2020-05-27 NOTE — ED Notes (Signed)
Patient didn't want to stay, advised to, but left. Moving pt OTF.

## 2020-05-27 NOTE — ED Notes (Signed)
No answer from pt in waiting room 

## 2020-05-27 NOTE — ED Triage Notes (Signed)
Pt c/o multiple bee stings to L leg and r hand. States he is not allergic. Was seen at [facility] but left due to wait time. No distress noted.

## 2020-05-27 NOTE — ED Triage Notes (Signed)
Pt. Stated, I have multiple bee stings. Pt not allergic to them

## 2020-05-28 MED ORDER — DEXAMETHASONE 6 MG PO TABS
10.0000 mg | ORAL_TABLET | Freq: Once | ORAL | Status: AC
  Administered 2020-05-28: 10 mg via ORAL
  Filled 2020-05-28: qty 1

## 2020-05-28 NOTE — ED Provider Notes (Signed)
[facility] EMERGENCY DEPARTMENT Provider Note   CSN: [id number] Arrival date & time: 05/27/20  1727   History Chief Complaint  Patient presents with   Insect Bite    Scott Tate is a 64 y.o. male.  The history is provided by the patient.     No past medical history on file.  There are no problems to display for this patient. He has history of having donated a kidney and comes in after suffering multiple insect stings.  He was doing some yard work and stepped on a yellow jacket nest and got stung on his left leg and right arm.  He initially had some slight difficulty breathing which quickly resolved.  He denies any rash.  He is complaining of pain at the sites of the stings and he rates pain at 6/10.  He denies any difficulty swallowing.  He has not had problems with reactions to stings in the past.  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[employer], L kidney    KIDNEY DONATION     PANCREATECTOMY         Family History  Problem Relation Age of Onset   Diabetes Mother    Hypertension Mother     Social History   Tobacco Use   Smoking status: Current Every Day Smoker    Packs/day: 0.50   Smokeless tobacco: Never Used  Vaping Use   Vaping Use: Never used  Substance Use Topics   Alcohol use: Yes    Alcohol/week: 8.0 standard drinks    Types: 8 Cans of beer per week    Comment: occ   Drug use: Yes    Types: Marijuana, Cocaine    Comment: last use 12/2014    Home Medications Prior to Admission medications   Medication Sig Start Date End Date Taking? Authorizing Provider  HYDROcodone-acetaminophen (NORCO/VICODIN) 5-325 MG tablet Take 1 tablet by mouth every 6 (six) hours as needed. 08/22/18   Muthersbaugh, Dahlia Client, PA-C  hydrOXYzine (ATARAX/VISTARIL) 25 MG tablet Take 1 tablet (25 mg total) by mouth every 6 (six) hours. Patient not taking: Reported on 08/22/2018 03/05/18   Wynetta Fines, MD    methocarbamol (ROBAXIN) 500 MG tablet Take 1 tablet (500 mg total) by mouth 2 (two) times daily. 08/22/18   Muthersbaugh, Dahlia Client, PA-C  naproxen sodium (ALEVE) 220 MG tablet Take 220 mg by mouth 2 (two) times daily as needed (pain).    [provider]  omeprazole (PRILOSEC) 20 MG capsule TAKE 1 CAPSULE BY MOUTH 2 TIMES DAILY BEFORE A MEAL. Patient not taking: Reported on 08/22/2018 03/02/18   Quentin Angst, MD  sucralfate (CARAFATE) 1 GM/10ML suspension Take 10 mLs (1 g total) by mouth 4 (four) times daily -  with meals and at bedtime. Patient not taking: Reported on 08/22/2018 04/19/18   Maczis, Elmer Sow, PA-C  tamsulosin (FLOMAX) 0.4 MG CAPS capsule Take 1 capsule (0.4 mg total) daily by mouth. Patient not taking: Reported on 04/19/2018 09/08/17   Bing Neighbors, FNP  vortioxetine HBr (TRINTELLIX) 10 MG TABS tablet Take 10 mg by mouth daily.    [provider]    Allergies    Patient has no known allergies.  Review of Systems   Review of Systems  All other systems reviewed and are negative.   Physical Exam Updated Vital Signs BP (!) 145/85 (BP Location: Right Arm)    Pulse 71    Temp 98.7  F (37.1 C) (Oral)    Resp 18    SpO2 99%   Physical Exam Vitals and nursing note reviewed.   64 year old male, resting comfortably and in no acute distress. Vital signs are significant for borderline elevated blood pressure. Oxygen saturation is 99%, which is normal. Head is normocephalic and atraumatic. PERRLA, EOMI. Oropharynx is clear. Neck is nontender and supple without adenopathy or JVD. Back is nontender and there is no CVA tenderness. Lungs are clear without rales, wheezes, or rhonchi. Chest is nontender. Heart has regular rate and rhythm without murmur. Abdomen is soft, flat, nontender without masses or hepatosplenomegaly and peristalsis is normoactive. Extremities have no cyanosis or edema, full range of motion is present. Skin: Stings are noted just  superior to the left knee, the right elbow, right second finger.  These areas have erythema and minimal swelling.  There is no generalized rash. Neurologic: Mental status is normal, cranial nerves are intact, there are no motor or sensory deficits.  ED Results / Procedures / Treatments    Procedures Procedures   Medications Ordered in ED Medications  dexamethasone (DECADRON) tablet 10 mg (has no administration in time range)    ED Course  I have reviewed the triage vital signs and the nursing notes.  MDM Rules/Calculators/A&P Hymenoptera stings without evidence of systemic reaction.  He is given a dose of dexamethasone and is advised to use over-the-counter analgesics and over-the-counter antihistamines as needed.  Old records are reviewed, and he has no relevant past visits.  Final Clinical Impression(s) / ED Diagnoses Final diagnoses:  Insect stings, accidental or unintentional, initial encounter    Rx / DC Orders ED Discharge Orders    None       Dione Booze, MD 05/28/20 0041

## 2020-05-28 NOTE — ED Notes (Signed)
Pt instructed to change into a gown.

## 2020-05-28 NOTE — Discharge Instructions (Addendum)
Apply ice as needed.  Take naproxen, ibuprofen, or acetaminophen as needed for pain.  Take loratadine or cetirizine once a day for the next week.  Take diphenhydramine as needed for breakthrough itching.  Have a professional pest service take care of the mast so you do not get stung again.

## 2020-08-04 ENCOUNTER — Other Ambulatory Visit: Payer: Self-pay

## 2020-08-04 ENCOUNTER — Emergency Department (HOSPITAL_COMMUNITY): Payer: Medicare HMO

## 2020-08-04 ENCOUNTER — Emergency Department (HOSPITAL_COMMUNITY)
Admission: EM | Admit: 2020-08-04 | Discharge: 2020-08-04 | Disposition: A | Discharge status: Home / Self Care | Payer: Medicare HMO | Attending: Emergency Medicine | Admitting: Emergency Medicine

## 2020-08-04 DIAGNOSIS — F1490 Cocaine use, unspecified, uncomplicated: Secondary | ICD-10-CM | POA: Insufficient documentation

## 2020-08-04 DIAGNOSIS — W19XXXA Unspecified fall, initial encounter: Secondary | ICD-10-CM | POA: Insufficient documentation

## 2020-08-04 DIAGNOSIS — S0003XA Contusion of scalp, initial encounter: Secondary | ICD-10-CM

## 2020-08-04 DIAGNOSIS — S20211A Contusion of right front wall of thorax, initial encounter: Secondary | ICD-10-CM | POA: Diagnosis not present

## 2020-08-04 DIAGNOSIS — Z79899 Other long term (current) drug therapy: Secondary | ICD-10-CM | POA: Insufficient documentation

## 2020-08-04 DIAGNOSIS — S0990XA Unspecified injury of head, initial encounter: Secondary | ICD-10-CM | POA: Diagnosis present

## 2020-08-04 DIAGNOSIS — F17200 Nicotine dependence, unspecified, uncomplicated: Secondary | ICD-10-CM | POA: Insufficient documentation

## 2020-08-04 DIAGNOSIS — Y92002 Bathroom of unspecified non-institutional (private) residence single-family (private) house as the place of occurrence of the external cause: Secondary | ICD-10-CM | POA: Insufficient documentation

## 2020-08-04 DIAGNOSIS — R001 Bradycardia, unspecified: Secondary | ICD-10-CM

## 2020-08-04 DIAGNOSIS — F1410 Cocaine abuse, uncomplicated: Secondary | ICD-10-CM

## 2020-08-04 LAB — CBC WITH DIFFERENTIAL/PLATELET
Abs Immature Granulocytes: 0.04 10*3/uL (ref 0.00–0.07)
Basophils Absolute: 0 10*3/uL (ref 0.0–0.1)
Basophils Relative: 0 %
Eosinophils Absolute: 0 10*3/uL (ref 0.0–0.5)
Eosinophils Relative: 0 %
HCT: 45.4 % (ref 39.0–52.0)
Hemoglobin: 14.8 g/dL (ref 13.0–17.0)
Immature Granulocytes: 0 %
Lymphocytes Relative: 11 %
Lymphs Abs: 1.6 10*3/uL (ref 0.7–4.0)
MCH: 28.7 pg (ref 26.0–34.0)
MCHC: 32.6 g/dL (ref 30.0–36.0)
MCV: 88 fL (ref 80.0–100.0)
Monocytes Absolute: 0.9 10*3/uL (ref 0.1–1.0)
Monocytes Relative: 7 %
Neutro Abs: 11.3 10*3/uL — ABNORMAL HIGH (ref 1.7–7.7)
Neutrophils Relative %: 82 %
Platelets: 269 10*3/uL (ref 150–400)
RBC: 5.16 MIL/uL (ref 4.22–5.81)
RDW: 13 % (ref 11.5–15.5)
WBC: 13.9 10*3/uL — ABNORMAL HIGH (ref 4.0–10.5)
nRBC: 0 % (ref 0.0–0.2)

## 2020-08-04 LAB — ETHANOL: Alcohol, Ethyl (B): <10 mg/dL (ref <10)

## 2020-08-04 LAB — COMPREHENSIVE METABOLIC PANEL
ALT: 25 U/L (ref 0–44)
AST: 25 U/L (ref 15–41)
Albumin: 4.1 g/dL (ref 3.5–5.0)
Alkaline Phosphatase: 53 U/L (ref 38–126)
Anion gap: 11 (ref 5–15)
BUN: 12 mg/dL (ref 8–23)
CO2: 24 mmol/L (ref 22–32)
Calcium: 9.7 mg/dL (ref 8.9–10.3)
Chloride: 102 mmol/L (ref 98–111)
Creatinine, Ser: 1.2 mg/dL (ref 0.61–1.24)
GFR calc non Af Amer: >60 mL/min (ref >60)
Glucose, Bld: 98 mg/dL (ref 70–99)
Potassium: 3.7 mmol/L (ref 3.5–5.1)
Sodium: 137 mmol/L (ref 135–145)
Total Bilirubin: 1.4 mg/dL — ABNORMAL HIGH (ref 0.3–1.2)
Total Protein: 7.8 g/dL (ref 6.5–8.1)

## 2020-08-04 LAB — URINALYSIS, ROUTINE W REFLEX MICROSCOPIC
Bilirubin Urine: NEGATIVE
Glucose, UA: NEGATIVE mg/dL
Hgb urine dipstick: NEGATIVE
Ketones, ur: NEGATIVE mg/dL
Leukocytes,Ua: NEGATIVE
Nitrite: NEGATIVE
Protein, ur: NEGATIVE mg/dL
Specific Gravity, Urine: 1.003 — ABNORMAL LOW (ref 1.005–1.030)
pH: 7 (ref 5.0–8.0)

## 2020-08-04 LAB — RAPID URINE DRUG SCREEN, HOSP PERFORMED
Amphetamines: NOT DETECTED
Barbiturates: NOT DETECTED
Benzodiazepines: NOT DETECTED
Cocaine: POSITIVE — AB
Opiates: NOT DETECTED
Tetrahydrocannabinol: NOT DETECTED

## 2020-08-04 LAB — AMMONIA: Ammonia: 29 umol/L (ref 9–35)

## 2020-08-04 LAB — PROTIME-INR
INR: 1 (ref 0.8–1.2)
Prothrombin Time: 12.8 seconds (ref 11.4–15.2)

## 2020-08-04 NOTE — ED Notes (Signed)
Food and drink offered. Pt ambulated around the room without assistance.

## 2020-08-04 NOTE — Discharge Instructions (Addendum)
It was our pleasure to provide your ER care today - we hope that you feel better.  Take acetaminophen or ibuprofen as need.   Avoid cocaine use - cocaine use can cause heart attacks, strokes, and sudden cardiac death - see additional information provided.   Regarding your slow heart rate - while noted previously, please follow up with your doctor/cardiologist in 1 week - call office today to arrange appointment.   Return to ER if worse, new symptoms, new or severe pain, trouble breathing, weak/fainting, or other concern.

## 2020-08-04 NOTE — ED Notes (Signed)
Scott Tate [phone], wife would like an update

## 2020-08-04 NOTE — ED Notes (Signed)
DC instructions reviewed with pt.  Pt verbalized understanding.  Pt DC. Pt in [place] to be picked up by wife.

## 2020-08-04 NOTE — ED Triage Notes (Signed)
Pt arrives to ED BIB [facility] due to a fall/unresponsive. Per EMS pt was at home found unresponsive on the bathroom floor by pt's wife. Per EMS pt's wife stated that the pt came home around 0415 from drinking. Per EMS pt's wife also stated that the pt has a Hx of cocaine/alcohol abuse. Pt currently A/Ox4.  BP 150/94 HR 80 98% RA CBG 115

## 2020-08-04 NOTE — ED Provider Notes (Addendum)
Signed out by Dr Blinda Leatherwood that work up almost complete, and to d/c to home when UA resulted.   UA neg for infection. UDS + cocaine.   Pt alert, content, no distress, denies any pain or other c/o currently.  Sinus brady on monitor. No faintness, no cp or sob. Noted to have similar sinus brady in past, no bblocker use.   Po fluids, food, ambulate in hall. No new c/o, no faintness. No pain or sob. Pt states feels ready to go home.   Pt currently appears stable for d/c.        Cathren Laine, MD 08/04/20 1025

## 2020-08-04 NOTE — ED Notes (Signed)
Pt to CT via stretcher

## 2020-08-04 NOTE — ED Provider Notes (Addendum)
[facility] EMERGENCY DEPARTMENT Provider Note   CSN: [id number] Arrival date & time: 08/04/20  0531     History Chief Complaint  Patient presents with  . Fall    Scott Tate is a 64 y.o. male.  Patient brought to the emergency department from home by ambulance after a fall.  Information provided by patient's wife to EMS.  She reports that he has a history of cocaine and alcohol abuse.  He was out all night.  She called him several times and he seemed to be getting more intoxicated over the course of the evening.  She was awakened from sleep at 415 when she heard a loud noise and found him on the ground in the bathroom.  He was initially not responsive.  EMS report that the patient seems intoxicated but has otherwise been doing well.  He complains of pain on the right side of his head and right ribs secondary to the fall.        No past medical history on file.  There are no problems to display for this patient.   Past Surgical History:  Procedure Laterality Date  . 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 KIDNEY DONATION    . PANCREATECTOMY         Family History  Problem Relation Age of Onset  . Diabetes Mother   . Hypertension Mother     Social History   Tobacco Use  . Smoking status: Current Every Day Smoker    Packs/day: 0.50  . Smokeless tobacco: Never Used  Vaping Use  . Vaping Use: Never used  Substance Use Topics  . Alcohol use: Yes    Alcohol/week: 8.0 standard drinks    Types: 8 Cans of beer per week    Comment: occ  . Drug use: Yes    Types: Marijuana, Cocaine    Comment: last use 12/2014    Home Medications Prior to Admission medications   Medication Sig Start Date End Date Taking? Authorizing Provider  naproxen sodium (ALEVE) 220 MG tablet Take 220 mg by mouth 2 (two) times daily as needed (pain).    [provider]  vortioxetine HBr (TRINTELLIX) 10 MG TABS tablet Take 10 mg by mouth daily.     [provider]  omeprazole (PRILOSEC) 20 MG capsule TAKE 1 CAPSULE BY MOUTH 2 TIMES DAILY BEFORE A MEAL. Patient not taking: Reported on 08/22/2018 03/02/18 05/28/20  Quentin Angst, MD  sucralfate (CARAFATE) 1 GM/10ML suspension Take 10 mLs (1 g total) by mouth 4 (four) times daily -  with meals and at bedtime. Patient not taking: Reported on 08/22/2018 04/19/18 05/28/20  Jacinto Halim, PA-C    Allergies    Patient has no known allergies.  Review of Systems   Review of Systems  Musculoskeletal:       Rib pain  Neurological: Positive for headaches.  All other systems reviewed and are negative.   Physical Exam Updated Vital Signs BP 127/79 (BP Location: Left Arm)   Pulse 60   Temp (!) 97.1 F (36.2 C) (Temporal)   Resp (!) 25   SpO2 100%   Physical Exam Vitals and nursing note reviewed.  Constitutional:      General: He is not in acute distress.    Appearance: Normal appearance. He is well-developed.  HENT:     Head: Normocephalic.     Comments: Tenderness right temporal parietal region but no hematoma, contusion,  abrasion or laceration    Right Ear: Hearing normal.     Left Ear: Hearing normal.     Nose: Nose normal.  Eyes:     Conjunctiva/sclera: Conjunctivae normal.     Pupils: Pupils are equal, round, and reactive to light.  Cardiovascular:     Rate and Rhythm: Regular rhythm.     Heart sounds: S1 normal and S2 normal. No murmur heard.  No friction rub. No gallop.   Pulmonary:     Effort: Pulmonary effort is normal. No respiratory distress.     Breath sounds: Normal breath sounds.  Chest:     Chest wall: Tenderness present. No deformity or crepitus.    Abdominal:     General: Bowel sounds are normal.     Palpations: Abdomen is soft.     Tenderness: There is no abdominal tenderness. There is no guarding or rebound. Negative signs include Murphy's sign and McBurney's sign.     Hernia: No hernia is present.  Musculoskeletal:        General:  Normal range of motion.     Cervical back: Normal range of motion and neck supple.  Skin:    General: Skin is warm and dry.     Findings: No rash.  Neurological:     Mental Status: He is alert and oriented to person, place, and time.     GCS: GCS eye subscore is 4. GCS verbal subscore is 5. GCS motor subscore is 6.     Cranial Nerves: No cranial nerve deficit.     Sensory: No sensory deficit.     Coordination: Coordination normal.     Comments: No focal deficits.  Equal grip strength bilaterally.  Lower extremity strength 5 out of 5 and symmetric.  Normal sensation across all dermatomes.  Psychiatric:        Speech: Speech normal.        Behavior: Behavior normal.        Thought Content: Thought content normal.     ED Results / Procedures / Treatments   Labs (all labs ordered are listed, but only abnormal results are displayed) Labs Reviewed  CBC WITH DIFFERENTIAL/PLATELET - Abnormal; Notable for the following components:      Result Value   WBC 13.9 (*)    Neutro Abs 11.3 (*)    All other components within normal limits  COMPREHENSIVE METABOLIC PANEL  PROTIME-INR  ETHANOL  RAPID URINE DRUG SCREEN, HOSP PERFORMED  AMMONIA  URINALYSIS, ROUTINE W REFLEX MICROSCOPIC    EKG None  Radiology No results found.  Procedures Procedures (including critical care time)  Medications Ordered in ED Medications - No data to display  ED Course  I have reviewed the triage vital signs and the nursing notes.  Pertinent labs & imaging results that were available during my care of the patient were reviewed by me and considered in my medical decision making (see chart for details).    MDM Rules/Calculators/A&P                          Patient with history of drug and alcohol abuse presents to the emergency department after a fall at home.  Wife reports that he was out all night and suspects that he was drinking and doing drugs.  She heard him fall at 4:15 AM and called EMS.  EMS  report that the patient was easily arousable for them and has complained of headache and right-sided rib  pain.  On arrival he does have tenderness on the right mid axillary rib area without deformity or crepitance.  No tenderness to deep palpation in the right upper quadrant or anywhere in his abdomen.  No back tenderness.  He does not have any complaints of the extremities and there is no swelling, deformity or decreased range of motion of upper or lower extremities.  Because he is intoxicated will perform CT head and cervical spine.  Because of his stated history of being difficult to arouse initially will perform additional work-up but suspect that this was secondary to drug and alcohol intake.  Patient appears well at this time.  Will sign to oncoming ER physician to follow-up on work-up.  Expect discharge if work-up is negative.  Final Clinical Impression(s) / ED Diagnoses Final diagnoses:  Contusion of scalp, initial encounter  Contusion of right chest wall, initial encounter    Rx / DC Orders ED Discharge Orders    None       Orra Nolde, Canary Brim, MD 08/04/20 [id number]    Gilda Crease, MD 08/04/20 [phone]

## 2020-08-22 ENCOUNTER — Encounter (HOSPITAL_COMMUNITY): Payer: Self-pay | Admitting: Emergency Medicine

## 2020-08-22 ENCOUNTER — Ambulatory Visit (HOSPITAL_COMMUNITY)
Admission: EM | Admit: 2020-08-22 | Discharge: 2020-08-22 | Disposition: A | Discharge status: Home / Self Care | Payer: Medicare HMO | Attending: Licensed Clinical Social Worker | Admitting: Licensed Clinical Social Worker

## 2020-08-22 ENCOUNTER — Other Ambulatory Visit: Payer: Self-pay

## 2020-08-22 DIAGNOSIS — R450 Nervousness: Secondary | ICD-10-CM | POA: Insufficient documentation

## 2020-08-22 DIAGNOSIS — F419 Anxiety disorder, unspecified: Secondary | ICD-10-CM | POA: Diagnosis not present

## 2020-08-22 DIAGNOSIS — F1020 Alcohol dependence, uncomplicated: Secondary | ICD-10-CM | POA: Diagnosis not present

## 2020-08-22 DIAGNOSIS — F1410 Cocaine abuse, uncomplicated: Secondary | ICD-10-CM

## 2020-08-22 DIAGNOSIS — F1420 Cocaine dependence, uncomplicated: Secondary | ICD-10-CM | POA: Diagnosis not present

## 2020-08-22 DIAGNOSIS — F1490 Cocaine use, unspecified, uncomplicated: Secondary | ICD-10-CM | POA: Insufficient documentation

## 2020-08-22 DIAGNOSIS — F329 Major depressive disorder, single episode, unspecified: Secondary | ICD-10-CM | POA: Insufficient documentation

## 2020-08-22 NOTE — Discharge Instructions (Signed)
Please contact the health care provider that prescribes your medications if you have any questions/concerns regarding your medications.   Discharge recommendations:   Please see information for follow-up appointment with psychiatry and therapy. Please follow up with your primary care provider for all medical related needs.  Therapy: We recommend that patient participate in individual therapy to address mental health concerns.  Safety:  The patient should abstain from use of illicit substances/drugs and abuse of any medications. If symptoms worsen or do not continue to improve or if the patient becomes actively suicidal or homicidal then it is recommended that the patient return to the closet hospital emergency department, the [facility], or call 911 for further evaluation and treatment. National Suicide Prevention Lifeline 1-800-SUICIDE or 539-211-5178.

## 2020-08-22 NOTE — BH Assessment (Signed)
Comprehensive Clinical Assessment (CCA) Screening, Triage and Referral Note  08/22/2020 Scott Tate [id number]   Scott Tate is a 64 year old male who presents to [facility] voluntarily for depression and substance use. Pt is accompanied by his wife who states that pt has been drinking more and abusing cocaine off and on for the last 2 years since his sisters suicide. Patient admits to abusing cocaine a day ago and states he uses occasionally a few times a month and drinks alcohol a few time a week, bottles of beer and liquor he states. Pt currently denies SI, HI, AVH and SIB, reports no past self harm or SI attempts. Pt states no current provider states he had a a provider  2 years ago at [facility] and took meds for depression. Pt reports no past of abuse but trauma from witnessing sisters suicide. Pt reports current depressive symptoms such as : isolation, irritability, worthlessness, anxiety. Pt reports he did not sleep last night and appetite is fair. Pt reports no access to weapons at time, no violence or hx of violence towards others. Pt reports he is seeking therapy at this time and help with depression, possible med management.    Diagnosis:Substance induced depressive disorder Disposition: Scott Tate, PMHNP recommends pt is psych cleared, TTS provide pt with outpatient resources, pt to follow up with provider at [facility] this week.  Visit Diagnosis: No diagnosis found.  Patient Reported Information How did you hear about [place]? Family/Friend   Referral name: [name]) ([name]))   Referral phone number: [phone])  Whom do you see for routine medical problems? I don't have a doctor;Primary Care   Practice/Facility Name: [facility])   Practice/Facility Phone Number: No data recorded  Name of Contact: No data recorded  Contact Number: No data recorded  Contact Fax Number: No data recorded  Prescriber Name: No data  recorded  Prescriber Address (if known): No data recorded What Is the Reason for Your Visit/Call Today? Detox and depression (Detox and depression)  How Long Has This Been Causing You Problems? > than 6 months  Have You Recently Been in Any Inpatient Treatment (Hospital/Detox/Crisis Center/28-Day Program)? No data recorded  Name/Location of Program/Hospital:No data recorded  How Long Were You There? No data recorded  When Were You Discharged? No data recorded Have You Ever Received Services From [facility] Before? No   Who Do You See at [facility]? No data recorded Have You Recently Had Any Thoughts About Hurting Yourself? No   Are You Planning to Commit Suicide/Harm Yourself At This time?  No  Have you Recently Had Thoughts About Hurting Someone Scott Tate? No   Explanation: No data recorded Have You Used Any Alcohol or Drugs in the Past 24 Hours? Yes   How Long Ago Did You Use Drugs or Alcohol?  No data recorded  What Did You Use and How Much? No data recorded What Do You Feel Would Help You the Most Today? No data recorded Do You Currently Have a Therapist/Psychiatrist? No   Name of Therapist/Psychiatrist: No data recorded  Have You Been Recently Discharged From Any Office Practice or Programs? No   Explanation of Discharge From Practice/Program:  No data recorded    CCA Screening Triage Referral Assessment Type of Contact: Face-to-Face   Is this Initial or Reassessment? No data recorded  Date Telepsych consult ordered in [facility]:  No data recorded  Time Telepsych consult ordered in [facility]:  No data  recorded Patient Reported Information Reviewed? Yes   Patient Left Without Being Seen? No data recorded  Reason for Not Completing Assessment: No data recorded Collateral Involvement: No data recorded Does Patient Have a Court Appointed Legal Guardian? No data recorded  Name and Contact of Legal Guardian:  No data recorded If Minor and Not Living with Parent(s), Who has Custody? No  data recorded Is CPS involved or ever been involved? Never  Is APS involved or ever been involved? Never  Patient Determined To Be At Risk for Harm To Self or Others Based on Review of Patient Reported Information or Presenting Complaint? No   Method: No data recorded  Availability of Means: No data recorded  Intent: No data recorded  Notification Required: No data recorded  Additional Information for Danger to Others Potential:  No data recorded  Additional Comments for Danger to Others Potential:  No data recorded  Are There Guns or Other Weapons in Your Home?  No data recorded   Types of Guns/Weapons: No data recorded   Are These Weapons Safely Secured?                              No data recorded   Who Could Verify You Are Able To Have These Secured:    No data recorded Do You Have any Outstanding Charges, Pending Court Dates, Parole/Probation? No data recorded Contacted To Inform of Risk of Harm To Self or Others: No data recorded Location of Assessment: [facility] Assessment Services  Does Patient Present under Involuntary Commitment? No   IVC Papers Initial File Date: No data recorded  [place] of Residence: [place]  Patient Currently Receiving the Following Services: Not Receiving Services   Determination of Need: Routine (7 days)   Options For Referral: Outpatient Resources  Scott Tate, [place]

## 2020-08-22 NOTE — ED Provider Notes (Signed)
Behavioral Health Urgent Care Medical Screening Exam  Patient Name: Scott Tate MRN: [id number] Date of Evaluation: 08/22/20 Chief Complaint:   Diagnosis:  Final diagnoses:  Alcohol use disorder, moderate, dependence (HCC)  Cocaine use disorder (HCC)    History of Present illness: Scott Tate is a 64 y.o. male who presents to [facility] requesting substance abuse treatment for alcohol use and cocaine use. Patient reports that after his sister's suicide two years ago he started using alcohol and cocaine more frequently. States that he drinks beer/liquor 3-4 days a week. When asked about the amount he states he does not know. States that he use cocaine about 1-2 times per week. Denies use of other substances.   On evaluation patient is alert and oriented x 4, pleasant, and cooperative. Speech is clear and coherent. Mood is depressed and affect is congruent with mood. Thought process is coherent and thought content is logical. Denies audiovisual hallucinations. No indication that patient is responding to internal stimuli. No evidence of delusional thought content. Denies suicidal ideations. Denies homicidal ideations.    Psychiatric Specialty Exam  Presentation  General Appearance:Appropriate for Environment;Well Groomed  Eye Contact:Good  Speech:Clear and Coherent;Normal Rate  Speech Volume:Normal  Handedness:Right   Mood and Affect  Mood:Anxious;Depressed  Affect:Congruent   Thought Process  Thought Processes:Coherent;Goal Directed;Linear  Descriptions of Associations:Intact  Orientation:Full (Time, Place and Person)  Thought Content:WDL  Hallucinations:None  Ideas of Reference:None  Suicidal Thoughts:No  Homicidal Thoughts:No   Sensorium  Memory:Immediate Good;Recent Good;Remote Good  Judgment:Fair  Insight:Fair   Executive Functions  Concentration:Good  Attention Span:Good  Recall:Good  Fund of Knowledge:Good  Language:Good   Psychomotor Activity   Psychomotor Activity:Normal   Assets  Assets:Communication Skills;Desire for Improvement;Physical Health;Housing;Intimacy;Transportation;Financial Resources/Insurance   Sleep  Sleep:Fair  Number of hours: No data recorded  Physical Exam: Physical Exam Constitutional:      General: He is not in acute distress.    Appearance: He is not ill-appearing, toxic-appearing or diaphoretic.  HENT:     Head: Normocephalic.     Right Ear: External ear normal.     Left Ear: External ear normal.  Eyes:     Pupils: Pupils are equal, round, and reactive to light.  Cardiovascular:     Rate and Rhythm: Normal rate.  Pulmonary:     Effort: Pulmonary effort is normal. No respiratory distress.  Musculoskeletal:        General: Normal range of motion.  Skin:    General: Skin is warm and dry.  Neurological:     Mental Status: He is alert and oriented to person, place, and time.  Psychiatric:        Mood and Affect: Mood is anxious and depressed.        Speech: Speech normal.        Behavior: Behavior is cooperative.        Thought Content: Thought content is not paranoid or delusional. Thought content does not include homicidal or suicidal ideation. Thought content does not include suicidal plan.    Review of Systems  Constitutional: Negative for chills, diaphoresis, fever, malaise/fatigue and weight loss.  HENT: Negative for congestion.   Respiratory: Negative for cough and shortness of breath.   Cardiovascular: Negative for chest pain and palpitations.  Gastrointestinal: Negative for diarrhea, nausea and vomiting.  Neurological: Negative for dizziness and seizures.  Psychiatric/Behavioral: Positive for depression and substance abuse. Negative for hallucinations, memory loss and suicidal ideas. The patient is nervous/anxious. The patient does not have insomnia.  All other systems reviewed and are negative.  Blood pressure (!) 144/87, pulse 73, temperature (!) 97.1 F (36.2 C),  temperature source Tympanic, resp. rate 18, SpO2 97 %. There is no height or weight on file to calculate BMI.  Musculoskeletal: Strength & Muscle Tone: within normal limits Gait & Station: normal Patient leans: N/A   [facility] MSE Discharge Disposition for Follow up and Recommendations: Based on my evaluation the patient does not appear to have an emergency medical condition and can be discharged with resources and follow up care in outpatient services for Medication Management and Substance Abuse Intensive Outpatient Program   Disposition: No evidence of imminent risk to self or others at present.   Patient does not meet criteria for psychiatric inpatient admission. Supportive therapy provided about ongoing stressors. Discussed crisis plan, support from social network, calling 911, coming to the Emergency Department, and calling Suicide Hotline. Patient provided resources for substance abuse treatment.   Jackelyn Poling, NP 08/22/2020, 8:36 PM

## 2020-08-22 NOTE — ED Triage Notes (Signed)
Patient with increased depression since sister found dead by him 2 years ago. Patient has increased his drug and alcohol use and desires detox. Patient denies SI/HI. Patient voices A/V hallucinations. Patient states voices tell me "crazy stuff". Patient states he sees shadows.

## 2021-02-03 ENCOUNTER — Emergency Department (HOSPITAL_COMMUNITY)
Admission: EM | Admit: 2021-02-03 | Discharge: 2021-02-03 | Disposition: A | Discharge status: Home / Self Care | Payer: Medicare HMO | Attending: Emergency Medicine | Admitting: Emergency Medicine

## 2021-02-03 ENCOUNTER — Emergency Department (HOSPITAL_COMMUNITY): Payer: Medicare HMO

## 2021-02-03 ENCOUNTER — Encounter (HOSPITAL_COMMUNITY): Payer: Self-pay | Admitting: Emergency Medicine

## 2021-02-03 ENCOUNTER — Other Ambulatory Visit: Payer: Self-pay

## 2021-02-03 DIAGNOSIS — R202 Paresthesia of skin: Secondary | ICD-10-CM | POA: Insufficient documentation

## 2021-02-03 DIAGNOSIS — F17200 Nicotine dependence, unspecified, uncomplicated: Secondary | ICD-10-CM | POA: Diagnosis not present

## 2021-02-03 DIAGNOSIS — Z79899 Other long term (current) drug therapy: Secondary | ICD-10-CM | POA: Diagnosis not present

## 2021-02-03 DIAGNOSIS — I10 Essential (primary) hypertension: Secondary | ICD-10-CM | POA: Diagnosis not present

## 2021-02-03 DIAGNOSIS — R519 Headache, unspecified: Secondary | ICD-10-CM | POA: Diagnosis not present

## 2021-02-03 DIAGNOSIS — M5412 Radiculopathy, cervical region: Secondary | ICD-10-CM

## 2021-02-03 HISTORY — DX: Essential (primary) hypertension: I10

## 2021-02-03 LAB — DIFFERENTIAL
Abs Immature Granulocytes: 0.02 10*3/uL (ref 0.00–0.07)
Basophils Absolute: 0.1 10*3/uL (ref 0.0–0.1)
Basophils Relative: 1 %
Eosinophils Absolute: 0.2 10*3/uL (ref 0.0–0.5)
Eosinophils Relative: 2 %
Immature Granulocytes: 0 %
Lymphocytes Relative: 22 %
Lymphs Abs: 2.4 10*3/uL (ref 0.7–4.0)
Monocytes Absolute: 0.9 10*3/uL (ref 0.1–1.0)
Monocytes Relative: 8 %
Neutro Abs: 7.4 10*3/uL (ref 1.7–7.7)
Neutrophils Relative %: 67 %

## 2021-02-03 LAB — COMPREHENSIVE METABOLIC PANEL
ALT: 18 U/L (ref 0–44)
AST: 25 U/L (ref 15–41)
Albumin: 3.9 g/dL (ref 3.5–5.0)
Alkaline Phosphatase: 58 U/L (ref 38–126)
Anion gap: 6 (ref 5–15)
BUN: 13 mg/dL (ref 8–23)
CO2: 27 mmol/L (ref 22–32)
Calcium: 9.5 mg/dL (ref 8.9–10.3)
Chloride: 106 mmol/L (ref 98–111)
Creatinine, Ser: 1.38 mg/dL — ABNORMAL HIGH (ref 0.61–1.24)
GFR, Estimated: 56 mL/min — ABNORMAL LOW (ref >60)
Glucose, Bld: 96 mg/dL (ref 70–99)
Potassium: 3.9 mmol/L (ref 3.5–5.1)
Sodium: 139 mmol/L (ref 135–145)
Total Bilirubin: 1.1 mg/dL (ref 0.3–1.2)
Total Protein: 7.7 g/dL (ref 6.5–8.1)

## 2021-02-03 LAB — PROTIME-INR
INR: 1.1 (ref 0.8–1.2)
Prothrombin Time: 13.4 seconds (ref 11.4–15.2)

## 2021-02-03 LAB — CBC
HCT: 44.9 % (ref 39.0–52.0)
Hemoglobin: 14.9 g/dL (ref 13.0–17.0)
MCH: 29.8 pg (ref 26.0–34.0)
MCHC: 33.2 g/dL (ref 30.0–36.0)
MCV: 89.8 fL (ref 80.0–100.0)
Platelets: 305 10*3/uL (ref 150–400)
RBC: 5 MIL/uL (ref 4.22–5.81)
RDW: 13.1 % (ref 11.5–15.5)
WBC: 11 10*3/uL — ABNORMAL HIGH (ref 4.0–10.5)
nRBC: 0 % (ref 0.0–0.2)

## 2021-02-03 LAB — APTT: aPTT: 35 seconds (ref 24–36)

## 2021-02-03 MED ORDER — CYCLOBENZAPRINE HCL 5 MG PO TABS: 5.0000 mg | ORAL_TABLET | Freq: Three times a day (TID) | ORAL | 0 refills | Status: DC | PRN

## 2021-02-03 MED ORDER — DIPHENHYDRAMINE HCL 50 MG/ML IJ SOLN
25.0000 mg | Freq: Once | INTRAMUSCULAR | Status: AC
  Administered 2021-02-03: 25 mg via INTRAVENOUS
  Filled 2021-02-03: qty 1

## 2021-02-03 MED ORDER — PREDNISONE 20 MG PO TABS: ORAL_TABLET | ORAL | 0 refills | Status: DC

## 2021-02-03 MED ORDER — SODIUM CHLORIDE 0.9% FLUSH
3.0000 mL | Freq: Once | INTRAVENOUS | Status: DC
Start: 2021-02-03 — End: 2021-02-04

## 2021-02-03 MED ORDER — SODIUM CHLORIDE 0.9 % IV BOLUS
1000.0000 mL | Freq: Once | INTRAVENOUS | Status: AC
  Administered 2021-02-03: 1000 mL via INTRAVENOUS

## 2021-02-03 MED ORDER — DEXAMETHASONE SODIUM PHOSPHATE 4 MG/ML IJ SOLN
4.0000 mg | Freq: Once | INTRAMUSCULAR | Status: AC
  Administered 2021-02-03: 4 mg via INTRAVENOUS
  Filled 2021-02-03: qty 1

## 2021-02-03 MED ORDER — METOCLOPRAMIDE HCL 5 MG/ML IJ SOLN
10.0000 mg | Freq: Once | INTRAMUSCULAR | Status: AC
  Administered 2021-02-03: 10 mg via INTRAVENOUS
  Filled 2021-02-03: qty 2

## 2021-02-03 NOTE — ED Triage Notes (Signed)
C/o intermittent tingling and numbness to L arm x 2-3 weeks that has been worse over the past week.  Also reports L sided headache x 1 week.  No arm drift.

## 2021-02-03 NOTE — ED Provider Notes (Signed)
[facility] EMERGENCY DEPARTMENT Provider Note   CSN: [id number] Arrival date & time: 02/03/21  1649     History Chief Complaint  Patient presents with  . Numbness  . Headache    Scott Tate is a 65 y.o. male hx of HTN here with headache, numbness.  States that for the last week or so he has been having left-sided headaches.  He states that he also has some intermittent numbness in the left arm as well.  He denies any arm weakness.  Denies any trouble speaking.  Denies any history of stroke in the past.  The history is provided by the patient.       Past Medical History:  Diagnosis Date  . Hypertension     There are no problems to display for this patient.   Past Surgical History:  Procedure Laterality Date  . 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 KIDNEY DONATION    . PANCREATECTOMY         Family History  Problem Relation Age of Onset  . Diabetes Mother   . Hypertension Mother     Social History   Tobacco Use  . Smoking status: Current Every Day Smoker    Packs/day: 0.50  . Smokeless tobacco: Never Used  Vaping Use  . Vaping Use: Never used  Substance Use Topics  . Alcohol use: Yes    Alcohol/week: 8.0 standard drinks    Types: 8 Cans of beer per week    Comment: occ  . Drug use: Yes    Types: Marijuana, Cocaine    Comment: last use 12/2014    Home Medications Prior to Admission medications   Medication Sig Start Date End Date Taking? Authorizing Provider  albuterol (VENTOLIN HFA) 108 (90 Base) MCG/ACT inhaler Inhale 2 puffs into the lungs every 6 (six) hours as needed for wheezing or shortness of breath.   Yes [provider]  losartan (COZAAR) 25 MG tablet Take 25 mg by mouth daily. 09/27/20  Yes [provider]  tamsulosin (FLOMAX) 0.4 MG CAPS capsule Take 0.4 mg by mouth daily. 07/14/20  Yes [provider]  traMADol (ULTRAM) 50 MG tablet Take 50 mg by mouth every 6 (six) hours as needed  (pain). 10/17/20  Yes [provider]  sucralfate (CARAFATE) 1 GM/10ML suspension Take 10 mLs (1 g total) by mouth 4 (four) times daily -  with meals and at bedtime. Patient not taking: Reported on 08/22/2018 04/19/18 05/28/20  Jacinto Halim, PA-C    Allergies    Patient has no known allergies.  Review of Systems   Review of Systems  Neurological: Positive for numbness and headaches.  All other systems reviewed and are negative.   Physical Exam Updated Vital Signs BP 111/63 (BP Location: Right Arm)   Pulse (!) 57   Temp 99 F (37.2 C)   Resp (!) 21   SpO2 98%   Physical Exam Vitals and nursing note reviewed.  Constitutional:      Appearance: He is well-developed.     Comments: Slightly uncomfortable  HENT:     Head: Normocephalic.     Mouth/Throat:     Mouth: Mucous membranes are moist.  Eyes:     Extraocular Movements: Extraocular movements intact.     Pupils: Pupils are equal, round, and reactive to light.  Cardiovascular:     Rate and Rhythm: Normal rate and regular rhythm.     Heart sounds:  Normal heart sounds.  Pulmonary:     Effort: Pulmonary effort is normal.     Breath sounds: Normal breath sounds.  Abdominal:     General: Bowel sounds are normal.     Palpations: Abdomen is soft.  Musculoskeletal:        General: Normal range of motion.     Cervical back: Normal range of motion and neck supple.  Skin:    General: Skin is warm.  Neurological:     Mental Status: He is alert and oriented to person, place, and time.     Comments: Slightly decreased sensation in C4 distribution on the left and upper arm.  Normal reflexes bilaterally.  Normal hand grasp.  Patient has normal pulses left upper extremity  Psychiatric:        Mood and Affect: Mood normal.        Behavior: Behavior normal.     ED Results / Procedures / Treatments   Labs (all labs ordered are listed, but only abnormal results are displayed) Labs Reviewed  CBC - Abnormal; Notable for  the following components:      Result Value   WBC 11.0 (*)    All other components within normal limits  COMPREHENSIVE METABOLIC PANEL - Abnormal; Notable for the following components:   Creatinine, Ser 1.38 (*)    GFR, Estimated 56 (*)    All other components within normal limits  PROTIME-INR  APTT  DIFFERENTIAL    EKG EKG Interpretation  Date/Time:  Saturday February 03 2021 17:05:48 EDT Ventricular Rate:  79 PR Interval:  132 QRS Duration: 98 QT Interval:  350 QTC Calculation: 401 R Axis:   80 Text Interpretation: Sinus rhythm with occasional Premature ventricular complexes Minimal voltage criteria for LVH, may be normal variant ( Cornell product ) Borderline ECG No significant change since last tracing Confirmed by Richardean Canal [phone]) on 02/03/2021 6:32:45 PM   Radiology CT HEAD WO CONTRAST  Result Date: 02/03/2021 CLINICAL DATA:  Acute neuro deficit. EXAM: CT HEAD WITHOUT CONTRAST TECHNIQUE: Contiguous axial images were obtained from the base of the skull through the vertex without intravenous contrast. COMPARISON:  August 04, 2020 FINDINGS: Brain: No evidence of acute infarction, hemorrhage, hydrocephalus, extra-axial collection or mass lesion/mass effect. Vascular: No hyperdense vessel or unexpected calcification. Skull: Normal. Negative for fracture or focal lesion. Sinuses/Orbits: No acute finding. Other: None. IMPRESSION: No acute intracranial abnormality. Electronically Signed   By: Ted Mcalpine M.D.   On: 02/03/2021 18:42   MR BRAIN WO CONTRAST  Result Date: 02/03/2021 CLINICAL DATA:  Left arm tingling and numbness. Left-sided headache. EXAM: MRI HEAD WITHOUT CONTRAST MRI CERVICAL SPINE WITHOUT CONTRAST TECHNIQUE: Multiplanar, multiecho pulse sequences of the brain and surrounding structures, and cervical spine, to include the craniocervical junction and cervicothoracic junction, were obtained without intravenous contrast. COMPARISON:  None. FINDINGS: MRI HEAD FINDINGS  Brain: No acute infarct, acute hemorrhage or extra-axial collection. Normal white matter signal. Normal volume of CSF spaces. No chronic microhemorrhage. Normal midline structures. Vascular: Major flow voids are preserved. Skull and upper cervical spine: Normal calvarium and skull base. Visualized upper cervical spine and soft tissues are normal. Sinuses/Orbits:No paranasal sinus fluid levels or advanced mucosal thickening. No mastoid or middle ear effusion. Normal orbits. MRI CERVICAL SPINE FINDINGS Alignment: Physiologic. Vertebrae: No fracture, evidence of discitis, or bone lesion. Cord: Normal signal and morphology. Posterior Fossa, vertebral arteries, paraspinal tissues: Negative. Disc levels: C1-2: Unremarkable. C2-3: Normal disc space and facet joints. There is no spinal  canal stenosis. No neural foraminal stenosis. C3-4: Mild left asymmetric disc bulge. There is no spinal canal stenosis. Mild left neural foraminal stenosis. C4-5: Left-sided disc osteophyte complex. There is no spinal canal stenosis. Severe left neural foraminal stenosis. C5-6: Small disc bulge with uncovertebral spurring. There is no spinal canal stenosis. Severe bilateral neural foraminal stenosis. C6-7: Normal disc space and facet joints. There is no spinal canal stenosis. No neural foraminal stenosis. C7-T1: Normal disc space and facet joints. There is no spinal canal stenosis. No neural foraminal stenosis. IMPRESSION: 1. Normal MRI of the brain. 2. 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 3. No spinal canal stenosis. Electronically Signed   By: Deatra RobinsonKevin  Herman M.D.   On: 02/03/2021 21:35   MR Cervical Spine Wo Contrast  Result Date: 02/03/2021 CLINICAL DATA:  Left arm tingling and numbness. Left-sided headache. EXAM: MRI HEAD WITHOUT CONTRAST MRI CERVICAL SPINE WITHOUT CONTRAST TECHNIQUE: Multiplanar, multiecho pulse sequences of the brain and surrounding structures, and cervical spine, to include the  craniocervical junction and cervicothoracic junction, were obtained without intravenous contrast. COMPARISON:  None. FINDINGS: MRI HEAD FINDINGS Brain: No acute infarct, acute hemorrhage or extra-axial collection. Normal white matter signal. Normal volume of CSF spaces. No chronic microhemorrhage. Normal midline structures. Vascular: Major flow voids are preserved. Skull and upper cervical spine: Normal calvarium and skull base. Visualized upper cervical spine and soft tissues are normal. Sinuses/Orbits:No paranasal sinus fluid levels or advanced mucosal thickening. No mastoid or middle ear effusion. Normal orbits. MRI CERVICAL SPINE FINDINGS Alignment: Physiologic. Vertebrae: No fracture, evidence of discitis, or bone lesion. Cord: Normal signal and morphology. Posterior Fossa, vertebral arteries, paraspinal tissues: Negative. Disc levels: C1-2: Unremarkable. C2-3: Normal disc space and facet joints. There is no spinal canal stenosis. No neural foraminal stenosis. C3-4: Mild left asymmetric disc bulge. There is no spinal canal stenosis. Mild left neural foraminal stenosis. C4-5: Left-sided disc osteophyte complex. There is no spinal canal stenosis. Severe left neural foraminal stenosis. C5-6: Small disc bulge with uncovertebral spurring. There is no spinal canal stenosis. Severe bilateral neural foraminal stenosis. C6-7: Normal disc space and facet joints. There is no spinal canal stenosis. No neural foraminal stenosis. C7-T1: Normal disc space and facet joints. There is no spinal canal stenosis. No neural foraminal stenosis. IMPRESSION: 1. Normal MRI of the brain. 2. 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 3. No spinal canal stenosis. Electronically Signed   By: Deatra RobinsonKevin  Herman M.D.   On: 02/03/2021 21:35    Procedures Procedures   Medications Ordered in ED Medications  sodium chloride flush (NS) 0.9 % injection 3 mL (3 mLs Intravenous Not Given 02/03/21 1804)  metoCLOPramide (REGLAN)  injection 10 mg (10 mg Intravenous Given 02/03/21 1856)  sodium chloride 0.9 % bolus 1,000 mL (0 mLs Intravenous Stopped 02/03/21 2242)  diphenhydrAMINE (BENADRYL) injection 25 mg (25 mg Intravenous Given 02/03/21 1855)  dexamethasone (DECADRON) injection 4 mg (4 mg Intravenous Given 02/03/21 1855)    ED Course  I have reviewed the triage vital signs and the nursing notes.  Pertinent labs & imaging results that were available during my care of the patient were reviewed by me and considered in my medical decision making (see chart for details).    MDM Rules/Calculators/A&P                         Delphia GratesClio W Donnie CoffinRubin is a 65 y.o. male who presented with left arm numbness and headaches.  Consider complex migraines versus stroke versus cervical radiculopathy Plan to give migraine cocktail and get basic lab work.  We will also get MRI brain and cervical spine.  11:04 PM Patient's MRI showed C4 foraminal stenosis.  Patient has just some numbness but no actual weakness.  Patient felt better after steroids and migraine cocktail.  Will discharge patient home with prednisone taper and some Flexeril.  Will refer to neurosurgery   Final Clinical Impression(s) / ED Diagnoses Final diagnoses:  None    Rx / DC Orders ED Discharge Orders    None       Charlynne Pander, MD 02/03/21 2305

## 2021-02-03 NOTE — ED Notes (Signed)
Attempted update for pts wife, no response on call at this time.

## 2021-02-03 NOTE — Discharge Instructions (Signed)
Take Tylenol or Motrin for pain.  Take prednisone as prescribed  Take Flexeril for muscle spasms  You need to see a neurosurgeon since you have a pinched nerve in the neck  Return to ER if you have worse numbness, weakness, trouble speaking

## 2021-02-03 NOTE — ED Notes (Signed)
Attempted to call pts wife, no response again.

## 2021-02-05 IMAGING — CR DG RIBS W/ CHEST 3+V*R*
3 series · 4 of 4 positions shown · non-contrast
Comparison: 08/22/2018.

CLINICAL DATA: Fall.  Rib pain.

EXAM:
RIGHT RIBS AND CHEST - 3+ VIEW

[Series 5: chest ap · 0.14mm/px · 2 of 2 slices shown]
[im 1/2]
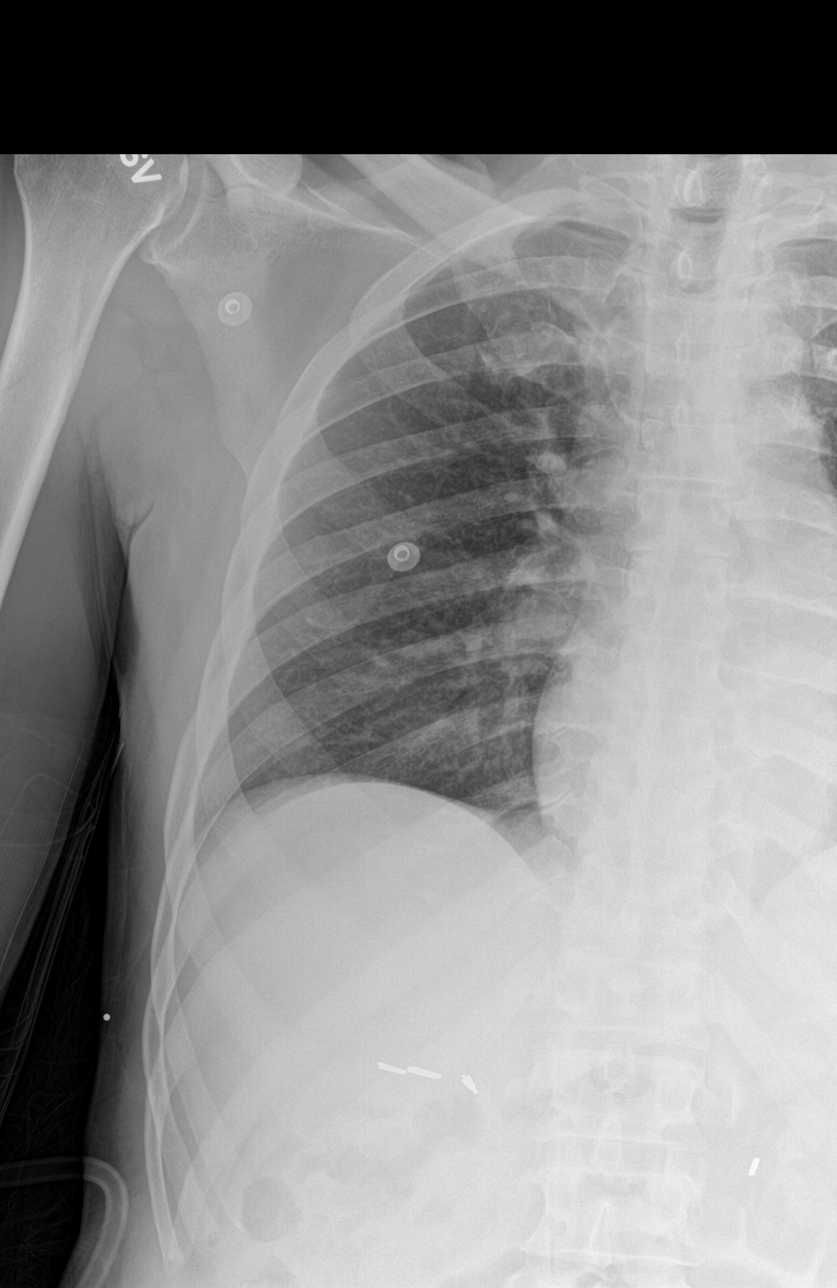
[im 2/2]
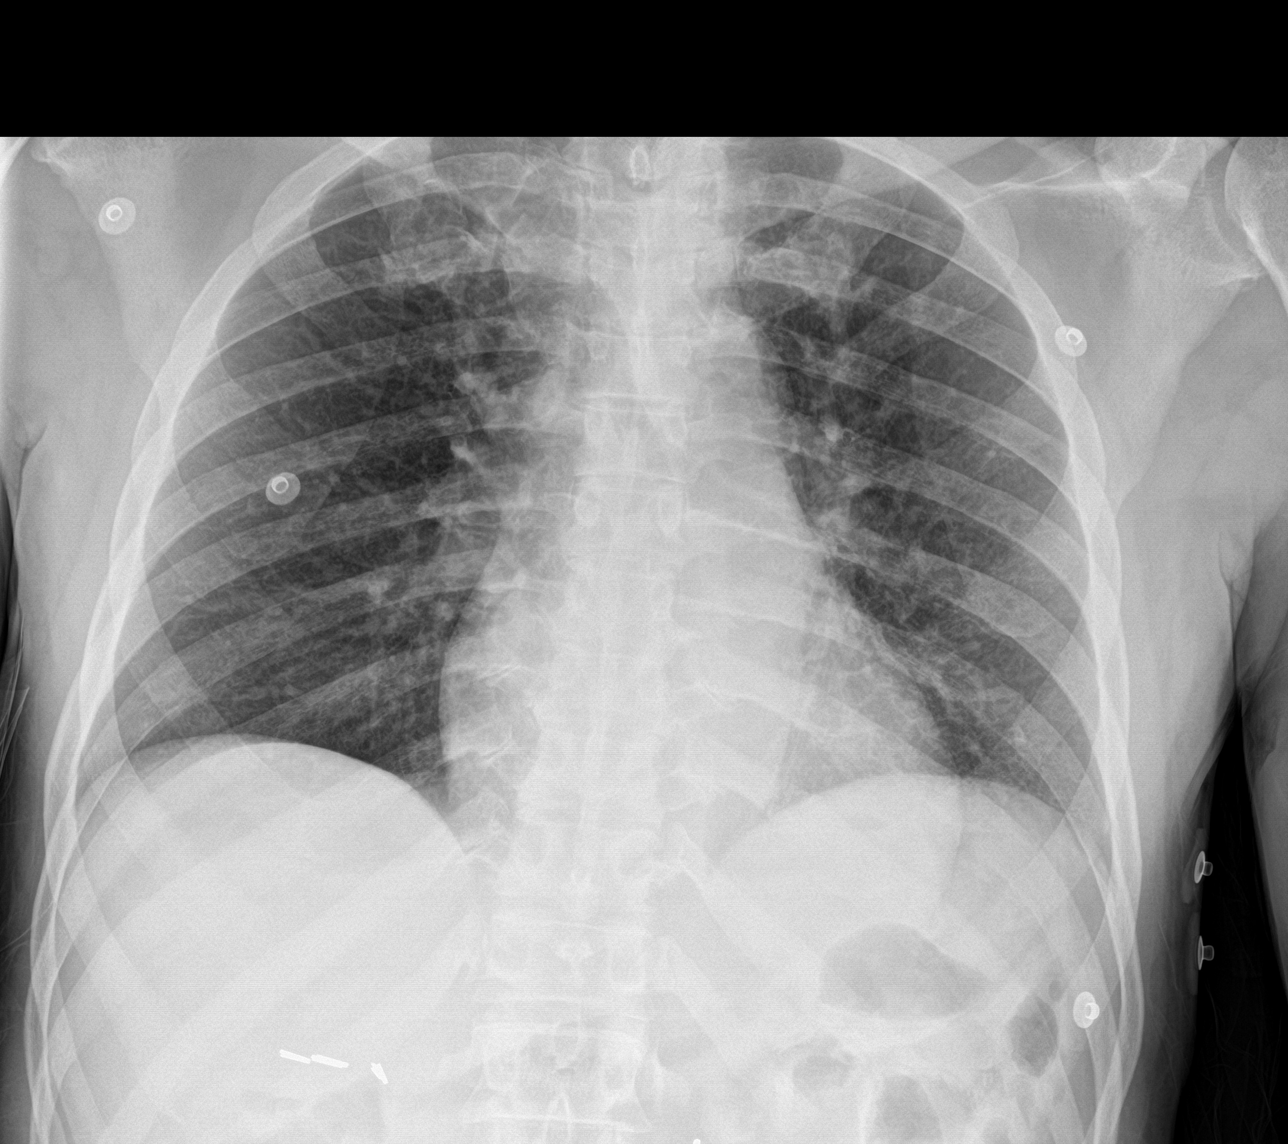

[rib ap obl (1 of 2)]
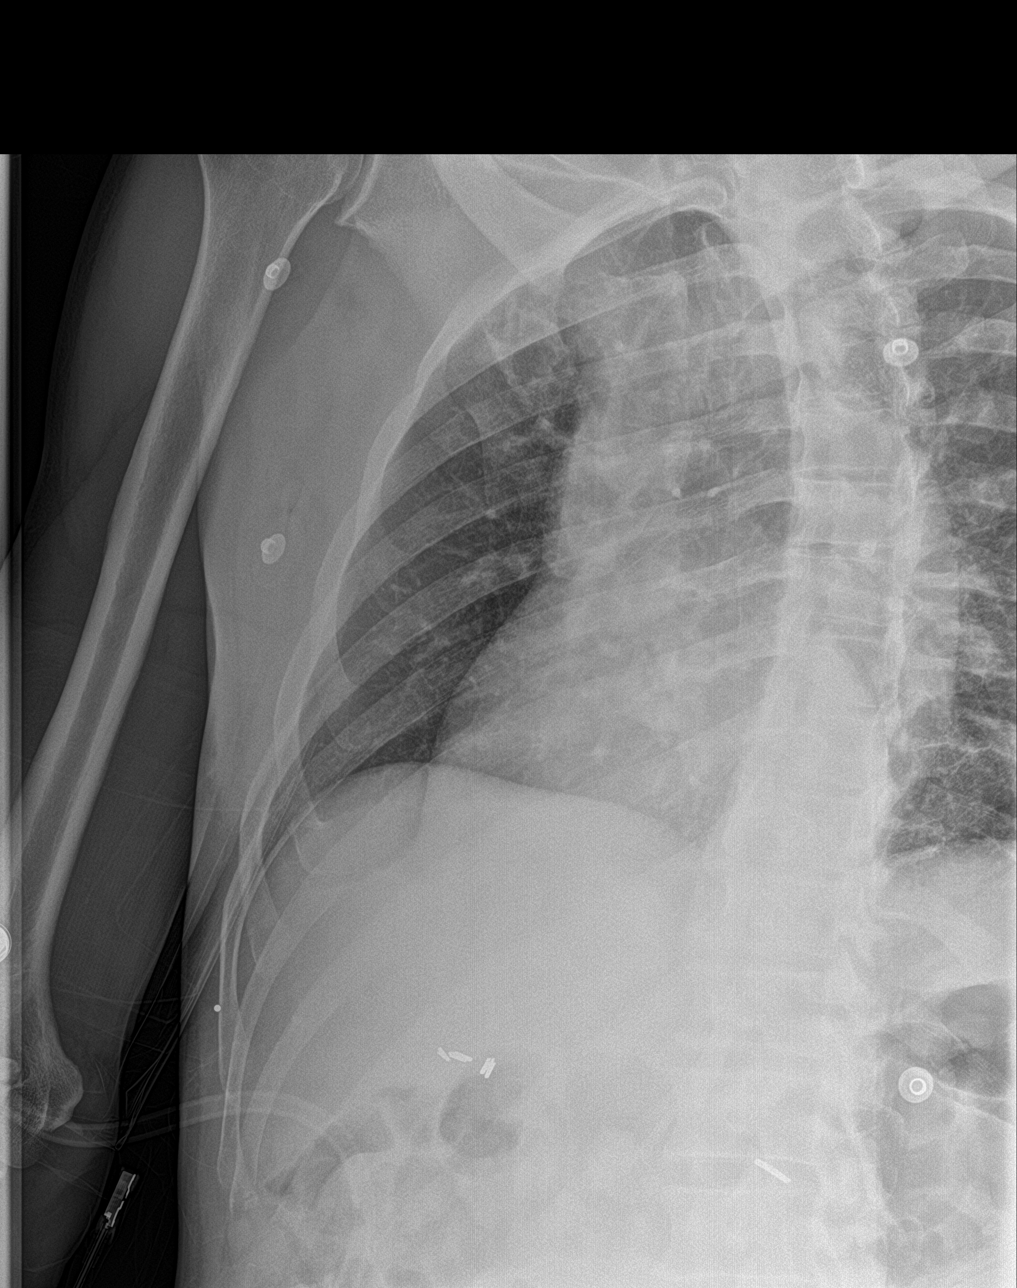

[rib ap obl (2 of 2)]
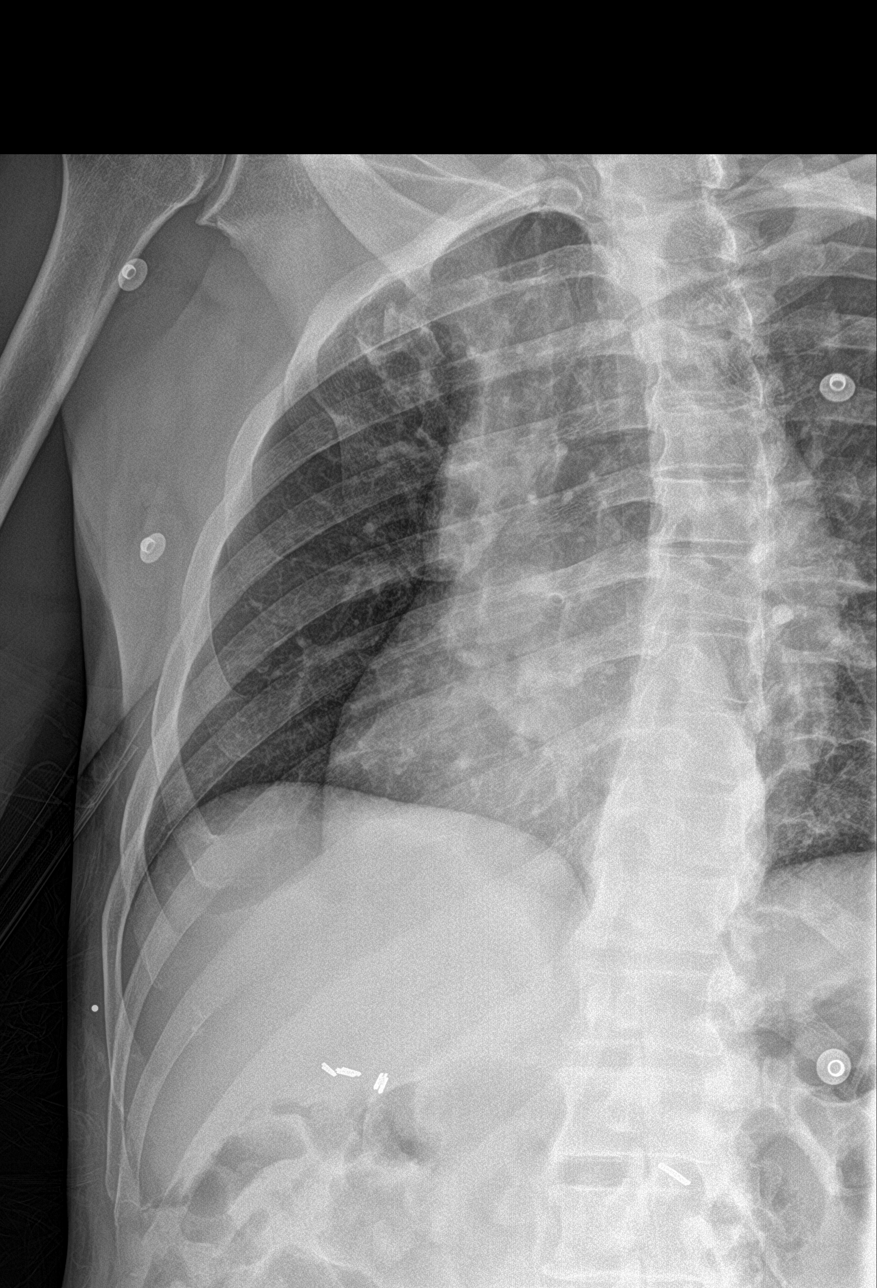

[4 of 4 positions shown; findings below may reference images not displayed]

FINDINGS: No evidence of displaced rib fracture or pneumothorax. No acute
cardiopulmonary disease identified. Surgical clips right upper
quadrant.
IMPRESSION: No evidence of displaced rib fracture or pneumothorax. No acute
cardiopulmonary disease noted.

## 2021-02-23 ENCOUNTER — Emergency Department (HOSPITAL_COMMUNITY)
Admission: EM | Admit: 2021-02-23 | Discharge: 2021-02-23 | Disposition: A | Discharge status: Home / Self Care | Payer: Medicare HMO | Attending: Emergency Medicine | Admitting: Emergency Medicine

## 2021-02-23 ENCOUNTER — Encounter (HOSPITAL_COMMUNITY): Payer: Self-pay | Admitting: Emergency Medicine

## 2021-02-23 ENCOUNTER — Emergency Department (HOSPITAL_COMMUNITY)
Admission: EM | Admit: 2021-02-23 | Discharge: 2021-02-23 | Disposition: A | Discharge status: Home / Self Care | Payer: Medicare HMO | Source: Home / Self Care | Attending: Emergency Medicine | Admitting: Emergency Medicine

## 2021-02-23 ENCOUNTER — Other Ambulatory Visit: Payer: Self-pay

## 2021-02-23 DIAGNOSIS — F17210 Nicotine dependence, cigarettes, uncomplicated: Secondary | ICD-10-CM | POA: Diagnosis not present

## 2021-02-23 DIAGNOSIS — K625 Hemorrhage of anus and rectum: Secondary | ICD-10-CM | POA: Insufficient documentation

## 2021-02-23 DIAGNOSIS — K649 Unspecified hemorrhoids: Secondary | ICD-10-CM | POA: Insufficient documentation

## 2021-02-23 DIAGNOSIS — Z5321 Procedure and treatment not carried out due to patient leaving prior to being seen by health care provider: Secondary | ICD-10-CM | POA: Insufficient documentation

## 2021-02-23 DIAGNOSIS — K5900 Constipation, unspecified: Secondary | ICD-10-CM

## 2021-02-23 DIAGNOSIS — K602 Anal fissure, unspecified: Secondary | ICD-10-CM

## 2021-02-23 DIAGNOSIS — Z79899 Other long term (current) drug therapy: Secondary | ICD-10-CM | POA: Insufficient documentation

## 2021-02-23 DIAGNOSIS — R109 Unspecified abdominal pain: Secondary | ICD-10-CM | POA: Diagnosis not present

## 2021-02-23 LAB — COMPREHENSIVE METABOLIC PANEL
ALT: 23 U/L (ref 0–44)
AST: 26 U/L (ref 15–41)
Albumin: 4.1 g/dL (ref 3.5–5.0)
Alkaline Phosphatase: 60 U/L (ref 38–126)
Anion gap: 11 (ref 5–15)
BUN: 18 mg/dL (ref 8–23)
CO2: 22 mmol/L (ref 22–32)
Calcium: 9.4 mg/dL (ref 8.9–10.3)
Chloride: 103 mmol/L (ref 98–111)
Creatinine, Ser: 1.35 mg/dL — ABNORMAL HIGH (ref 0.61–1.24)
GFR, Estimated: 58 mL/min — ABNORMAL LOW (ref >60)
Glucose, Bld: 79 mg/dL (ref 70–99)
Potassium: 3.9 mmol/L (ref 3.5–5.1)
Sodium: 136 mmol/L (ref 135–145)
Total Bilirubin: 1.2 mg/dL (ref 0.3–1.2)
Total Protein: 7.9 g/dL (ref 6.5–8.1)

## 2021-02-23 LAB — CBC WITH DIFFERENTIAL/PLATELET
Abs Immature Granulocytes: 0.06 10*3/uL (ref 0.00–0.07)
Basophils Absolute: 0.1 10*3/uL (ref 0.0–0.1)
Basophils Relative: 0 %
Eosinophils Absolute: 0 10*3/uL (ref 0.0–0.5)
Eosinophils Relative: 0 %
HCT: 41.4 % (ref 39.0–52.0)
Hemoglobin: 13.8 g/dL (ref 13.0–17.0)
Immature Granulocytes: 0 %
Lymphocytes Relative: 6 %
Lymphs Abs: 1 10*3/uL (ref 0.7–4.0)
MCH: 29.2 pg (ref 26.0–34.0)
MCHC: 33.3 g/dL (ref 30.0–36.0)
MCV: 87.7 fL (ref 80.0–100.0)
Monocytes Absolute: 0.5 10*3/uL (ref 0.1–1.0)
Monocytes Relative: 3 %
Neutro Abs: 14.7 10*3/uL — ABNORMAL HIGH (ref 1.7–7.7)
Neutrophils Relative %: 91 %
Platelets: 245 10*3/uL (ref 150–400)
RBC: 4.72 MIL/uL (ref 4.22–5.81)
RDW: 12.8 % (ref 11.5–15.5)
WBC: 16.3 10*3/uL — ABNORMAL HIGH (ref 4.0–10.5)
nRBC: 0 % (ref 0.0–0.2)

## 2021-02-23 LAB — TROPONIN I (HIGH SENSITIVITY): Troponin I (High Sensitivity): 8 ng/L (ref <18)

## 2021-02-23 LAB — CBG MONITORING, ED: Glucose-Capillary: 82 mg/dL (ref 70–99)

## 2021-02-23 LAB — POC OCCULT BLOOD, ED: Fecal Occult Bld: POSITIVE — AB

## 2021-02-23 MED ORDER — KETOROLAC TROMETHAMINE 30 MG/ML IJ SOLN
15.0000 mg | Freq: Once | INTRAMUSCULAR | Status: AC
  Administered 2021-02-23: 15 mg via INTRAVENOUS
  Filled 2021-02-23: qty 1

## 2021-02-23 MED ORDER — DOCUSATE SODIUM 100 MG PO CAPS
100.0000 mg | ORAL_CAPSULE | Freq: Once | ORAL | Status: AC
  Administered 2021-02-23: 100 mg via ORAL
  Filled 2021-02-23: qty 1

## 2021-02-23 MED ORDER — LIDOCAINE HCL URETHRAL/MUCOSAL 2 % EX GEL
1.0000 "application " | Freq: Once | CUTANEOUS | Status: AC
  Administered 2021-02-23: 1 via TOPICAL
  Filled 2021-02-23: qty 11

## 2021-02-23 MED ORDER — HYDROCORTISONE (PERIANAL) 2.5 % EX CREA: 1.0000 "application " | TOPICAL_CREAM | Freq: Two times a day (BID) | CUTANEOUS | 0 refills | Status: AC

## 2021-02-23 MED ORDER — DOCUSATE SODIUM 250 MG PO CAPS: 250.0000 mg | ORAL_CAPSULE | Freq: Every day | ORAL | 0 refills | Status: AC

## 2021-02-23 MED ORDER — LIDOCAINE HCL 2 % EX GEL: 1.0000 "application " | Freq: Three times a day (TID) | CUTANEOUS | 0 refills | Status: AC | PRN

## 2021-02-23 MED ORDER — HYDROCORTISONE ACETATE 25 MG RE SUPP
25.0000 mg | Freq: Once | RECTAL | Status: AC
  Administered 2021-02-23: 25 mg via RECTAL
  Filled 2021-02-23: qty 1

## 2021-02-23 MED ORDER — FENTANYL CITRATE (PF) 100 MCG/2ML IJ SOLN
50.0000 ug | Freq: Once | INTRAMUSCULAR | Status: AC
  Administered 2021-02-23: 50 ug via INTRAVENOUS
  Filled 2021-02-23: qty 2

## 2021-02-23 NOTE — Discharge Instructions (Addendum)
You are seen in the ER for rectal bleeding and abdominal pain.  Your pain is a result of likely anal fissures.  The treatment will be conservative with topical medications to reduce inflammation, given numbing effects.  You will also need to take stool softeners and drink plenty of water.    Return to the ER if you start having fevers, severe pain, worsening bloody stools.  Otherwise see your primary care doctor in 1 week.

## 2021-02-23 NOTE — ED Triage Notes (Signed)
Patient had reported syncopal episode in the lobby. Alert to sternal rub upon arrival. Reports rectal pain today with hemorrhoids x3 weeks. Reports bright red rectal bleeding today with urinary retention.

## 2021-02-23 NOTE — ED Notes (Signed)
EKG completed @1841 

## 2021-02-23 NOTE — ED Notes (Signed)
Pt called family member to pick them up, family member came to waiting room and helped pt get in the car to leave.

## 2021-02-23 NOTE — ED Notes (Signed)
Pt states that he has not urinated since approx noon. Bladder scan performed and 170

## 2021-02-27 NOTE — ED Provider Notes (Signed)
[facility]-EMERGENCY DEPT Provider Note   CSN: [id number] Arrival date & time: 02/23/21  1819     History Chief Complaint  Patient presents with  . Rectal Bleeding    Scott Tate is a 65 y.o. male.  HPI    65 year old male comes in a chief complaint of abdominal pain and rectal bleeding. He reports mainly having rectal bleeding as the main complaint.  Patient reports that his been constipated for the last few days.  Today he was straining to have a BM.  He did have a BM, it was hard, thereafter he has had bloody stools.  He is having pain with sitting.  He has some lower quadrant abdominal pain that is mild.  Patient denies any nausea, vomiting.  He is not on any blood thinners  Past Medical History:  Diagnosis Date  . Hypertension     There are no problems to display for this patient.   Past Surgical History:  Procedure Laterality Date  . 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 KIDNEY DONATION    . PANCREATECTOMY         Family History  Problem Relation Age of Onset  . Diabetes Mother   . Hypertension Mother     Social History   Tobacco Use  . Smoking status: Current Every Day Smoker    Packs/day: 0.50  . Smokeless tobacco: Never Used  Vaping Use  . Vaping Use: Never used  Substance Use Topics  . Alcohol use: Yes    Alcohol/week: 8.0 standard drinks    Types: 8 Cans of beer per week    Comment: occ  . Drug use: Yes    Types: Marijuana, Cocaine    Comment: last use 12/2014    Home Medications Prior to Admission medications   Medication Sig Start Date End Date Taking? Authorizing Provider  albuterol (VENTOLIN HFA) 108 (90 Base) MCG/ACT inhaler Inhale 2 puffs into the lungs every 6 (six) hours as needed for wheezing or shortness of breath.   Yes [provider]  cyclobenzaprine (FLEXERIL) 5 MG tablet Take 1 tablet (5 mg total) by mouth 3 (three) times daily as needed. Patient taking differently: Take 5 mg by  mouth 3 (three) times daily as needed for muscle spasms. 02/03/21  Yes Charlynne Pander, MD  docusate sodium (COLACE) 250 MG capsule Take 1 capsule (250 mg total) by mouth daily. 02/23/21  Yes Yulisa Chirico, MD  hydrocortisone (ANUSOL-HC) 2.5 % rectal cream Place 1 application rectally 2 (two) times daily for 5 days. 02/23/21 02/28/21 Yes Jevaeh Shams, MD  lidocaine (XYLOCAINE) 2 % jelly Apply 1 application topically 3 (three) times daily as needed. Max use for 7 days 02/23/21  Yes Cerra Eisenhower, MD  Lidocaine, Anorectal, 5 % CREA Apply 1 application topically daily as needed (pain).   Yes [provider]  losartan (COZAAR) 25 MG tablet Take 25 mg by mouth daily. 09/27/20  Yes [provider]  omeprazole (PRILOSEC) 20 MG capsule Take 20 mg by mouth daily. 02/08/21  Yes [provider]  Pramox-PE-Glycerin-Petrolatum (PREPARATION H) 1-0.25-14.4-15 % CREA Apply 1 application topically 3 (three) times daily.   Yes [provider]  predniSONE (DELTASONE) 20 MG tablet Take 60 mg daily x 2 days then 40 mg daily x 2 days then 20 mg daily x 2 days Patient taking differently: Take 20 mg by mouth See admin instructions. Take 60 mg daily x 2 days then  40 mg daily x 2 days then 20 mg daily x 2 days 02/03/21  Yes Charlynne Pander, MD  tamsulosin [facility]) 0.4 MG CAPS capsule Take 0.4 mg by mouth daily. 07/14/20  Yes [provider]  traMADol (ULTRAM) 50 MG tablet Take 50 mg by mouth every 6 (six) hours as needed for severe pain (pain). 10/17/20  Yes [provider]  sucralfate (CARAFATE) 1 GM/10ML suspension Take 10 mLs (1 g total) by mouth 4 (four) times daily -  with meals and at bedtime. Patient not taking: Reported on 08/22/2018 04/19/18 05/28/20  Jacinto Halim, PA-C    Allergies    Patient has no known allergies.  Review of Systems   Review of Systems  Constitutional: Positive for activity change.  Gastrointestinal: Positive for anal bleeding and  rectal pain.  Allergic/Immunologic: Negative for immunocompromised state.  Hematological: Does not bruise/bleed easily.  All other systems reviewed and are negative.   Physical Exam Updated Vital Signs BP 119/75   Pulse 77   Temp 98.6 F (37 C) (Oral)   Resp 18   SpO2 94%   Physical Exam Vitals and nursing note reviewed.  Constitutional:      Appearance: He is well-developed.  HENT:     Head: Normocephalic and atraumatic.  Eyes:     Conjunctiva/sclera: Conjunctivae normal.     Pupils: Pupils are equal, round, and reactive to light.  Cardiovascular:     Rate and Rhythm: Normal rate and regular rhythm.  Pulmonary:     Effort: Pulmonary effort is normal.     Breath sounds: Normal breath sounds.  Abdominal:     General: Bowel sounds are normal. There is no distension.     Palpations: Abdomen is soft. There is no mass.     Tenderness: There is no abdominal tenderness. There is no guarding or rebound.     Comments: DRE -no rectal mass or evidence of abscess  Musculoskeletal:        General: No deformity.     Cervical back: Normal range of motion and neck supple.  Skin:    General: Skin is warm.  Neurological:     Mental Status: He is alert and oriented to person, place, and time.     ED Results / Procedures / Treatments   Labs (all labs ordered are listed, but only abnormal results are displayed) Labs Reviewed  CBC WITH DIFFERENTIAL/PLATELET - Abnormal; Notable for the following components:      Result Value   WBC 16.3 (*)    Neutro Abs 14.7 (*)    All other components within normal limits  COMPREHENSIVE METABOLIC PANEL - Abnormal; Notable for the following components:   Creatinine, Ser 1.35 (*)    GFR, Estimated 58 (*)    All other components within normal limits  POC OCCULT BLOOD, ED - Abnormal; Notable for the following components:   Fecal Occult Bld POSITIVE (*)    All other components within normal limits  CBG MONITORING, ED  TROPONIN I (HIGH SENSITIVITY)     EKG EKG Interpretation  Date/Time:  Friday February 23 2021 18:41:53 EDT Ventricular Rate:  87 PR Interval:  140 QRS Duration: 82 QT Interval:  372 QTC Calculation: 447 R Axis:   86 Text Interpretation: Sinus rhythm with frequent Premature ventricular complexes Otherwise normal ECG No acute changes No significant change since last tracing Confirmed by Derwood Kaplan [phone]) on 02/23/2021 8:16:31 PM   Radiology No results found.  Procedures Procedures   Medications  Ordered in ED Medications  lidocaine (XYLOCAINE) 2 % jelly 1 application (1 application Topical Given 02/23/21 2110)  hydrocortisone (ANUSOL-HC) suppository 25 mg (25 mg Rectal Given 02/23/21 2144)  ketorolac (TORADOL) 30 MG/ML injection 15 mg (15 mg Intravenous Given 02/23/21 2109)  fentaNYL (SUBLIMAZE) injection 50 mcg (50 mcg Intravenous Given 02/23/21 2109)  docusate sodium (COLACE) capsule 100 mg (100 mg Oral Given 02/23/21 2307)    ED Course  I have reviewed the triage vital signs and the nursing notes.  Pertinent labs & imaging results that were available during my care of the patient were reviewed by me and considered in my medical decision making (see chart for details).    MDM Rules/Calculators/A&P                          65 year old male with history of constipation comes with a chief complaint of rectal bleeding and abdominal discomfort.  His main complaint is rectal bleeding that started after he strained to have a BM.  Patient did have hard stools past, but thereafter he has been having rectal pain and bleeding.  Our exam is reassuring over the peritoneum.  Digital rectal exam did not reveal gross blood.  Patient reports that he has had colonoscopies within the last 5 years which are reassuring, patient given Anusol and lidocaine and he is a lot comfortable on reassessment.  Hemoglobin is stable.   Plan is to treat conservatively with strict ER return precautions.  Advised PCP follow-up for further  management and also consider taking regular fiber supplement or stool softener for constipation.  Final Clinical Impression(s) / ED Diagnoses Final diagnoses:  Rectal bleeding  Anal fissure  Constipation, unspecified constipation type    Rx / DC Orders ED Discharge Orders         Ordered    docusate sodium (COLACE) 250 MG capsule  Daily        02/23/21 2231    hydrocortisone (ANUSOL-HC) 2.5 % rectal cream  2 times daily        02/23/21 2232    lidocaine (XYLOCAINE) 2 % jelly  3 times daily PRN        02/23/21 2233           Derwood Kaplan, MD 02/27/21 [phone]

## 2021-03-04 ENCOUNTER — Encounter (HOSPITAL_COMMUNITY): Payer: Self-pay | Admitting: Emergency Medicine

## 2021-03-04 ENCOUNTER — Emergency Department (HOSPITAL_COMMUNITY)
Admission: EM | Admit: 2021-03-04 | Discharge: 2021-03-04 | Disposition: A | Discharge status: Home / Self Care | Payer: Medicare HMO | Attending: Emergency Medicine | Admitting: Emergency Medicine

## 2021-03-04 ENCOUNTER — Other Ambulatory Visit: Payer: Self-pay

## 2021-03-04 DIAGNOSIS — F17200 Nicotine dependence, unspecified, uncomplicated: Secondary | ICD-10-CM | POA: Diagnosis not present

## 2021-03-04 DIAGNOSIS — K6289 Other specified diseases of anus and rectum: Secondary | ICD-10-CM | POA: Diagnosis not present

## 2021-03-04 DIAGNOSIS — I10 Essential (primary) hypertension: Secondary | ICD-10-CM | POA: Diagnosis not present

## 2021-03-04 MED ORDER — HYDROMORPHONE HCL 1 MG/ML IJ SOLN
1.0000 mg | Freq: Once | INTRAMUSCULAR | Status: AC
  Administered 2021-03-04: 1 mg via INTRAMUSCULAR
  Filled 2021-03-04: qty 1

## 2021-03-04 MED ORDER — LIDOCAINE HCL URETHRAL/MUCOSAL 2 % EX GEL
1.0000 "application " | Freq: Once | CUTANEOUS | Status: AC
  Administered 2021-03-04: 1
  Filled 2021-03-04: qty 11

## 2021-03-04 NOTE — ED Provider Notes (Signed)
[facility]-EMERGENCY DEPT [facility] Emergency Department Provider Note MRN:  [id number]  Arrival date & time: 03/04/21     Chief Complaint   Rectal Pain   History of Present Illness   Scott Tate is a 65 y.o. year-old male with a history of hypertension, hemorrhoids presenting to the ED with chief complaint of rectal pain.  Recently diagnosed with hemorrhoids and/or anal fissure, here with severe pain.  Was trying to pass a stool earlier this evening but it was firm and caused pain.  Patient has moderate distress due to the discomfort.  No fever, no other complaints.  Review of Systems  A complete 10 system review of systems was obtained and all systems are negative except as noted in the HPI and PMH.   Patient's Health History    Past Medical History:  Diagnosis Date  . Hypertension     Past Surgical History:  Procedure Laterality Date  . 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 KIDNEY DONATION    . PANCREATECTOMY      Family History  Problem Relation Age of Onset  . Diabetes Mother   . Hypertension Mother     Social History   Socioeconomic History  . Marital status: Married    Spouse name: Not on file  . Number of children: Not on file  . Years of education: Not on file  . Highest education level: Not on file  Occupational History  . Not on file  Tobacco Use  . Smoking status: Current Every Day Smoker    Packs/day: 0.50  . Smokeless tobacco: Never Used  Vaping Use  . Vaping Use: Never used  Substance and Sexual Activity  . Alcohol use: Yes    Alcohol/week: 8.0 standard drinks    Types: 8 Cans of beer per week    Comment: occ  . Drug use: Yes    Types: Marijuana, Cocaine    Comment: last use 12/2014  . Sexual activity: Not on file  Other Topics Concern  . Not on file  Social History Narrative  . Not on file   Social Determinants of Health   Financial Resource Strain: Not on file  Food Insecurity: Not on file  Transportation Needs: Not  on file  Physical Activity: Not on file  Stress: Not on file  Social Connections: Not on file  Intimate Partner Violence: Not on file     Physical Exam   Vitals:   03/04/21 0143  BP: (!) 169/87  Pulse: 86  Resp: (!) 24  Temp: (!) 97.4 F (36.3 C)  SpO2: 98%    CONSTITUTIONAL: Well-appearing, in moderate distress due to pain NEURO:  Alert and oriented x 3, no focal deficits EYES:  eyes equal and reactive ENT/NECK:  no LAD, no JVD CARDIO: Regular rate, well-perfused, normal S1 and S2 PULM:  CTAB no wheezing or rhonchi GI/GU:  normal bowel sounds, non-distended, non-tender; small tender external hemorrhoid at the 7 o'clock position, not thrombosed; no erythema or fluctuance to the anal region MSK/SPINE:  No gross deformities, no edema SKIN:  no rash, atraumatic PSYCH:  Appropriate speech and behavior  *Additional and/or pertinent findings included in MDM below  Diagnostic and Interventional Summary    EKG Interpretation  Date/Time:    Ventricular Rate:    PR Interval:    QRS Duration:   QT Interval:    QTC Calculation:   R Axis:     Text Interpretation:  Labs Reviewed - No data to display  No orders to display    Medications  HYDROmorphone (DILAUDID) injection 1 mg (1 mg Intramuscular Given 03/04/21 0230)  lidocaine (XYLOCAINE) 2 % jelly 1 application (1 application Other Given 03/04/21 0231)     Procedures  /  Critical Care Procedures  ED Course and Medical Decision Making  I have reviewed the triage vital signs, the nursing notes, and pertinent available records from the EMR.  Listed above are laboratory and imaging tests that I personally ordered, reviewed, and interpreted and then considered in my medical decision making (see below for details).  Exam is overall reassuring.  I suspect the pain has triggered some type of anxiety or panic episode.  Normal vital signs, afebrile, no evidence of infection.  Patient feeling much better after medications  listed above, no return of pain after few hours of observation.  Was able to have a bowel movement here in the emergency department.  Advised continuing medications at home and increasing bowel regimen for softer stools.  Appropriate for discharge.       Elmer Sow. Pilar Plate, MD [facility] Emergency Medicine [facility] [email or web address]  Final Clinical Impressions(s) / ED Diagnoses     ICD-10-CM   1. Rectal pain  K62.89     ED Discharge Orders    None       Discharge Instructions Discussed with and Provided to Patient:     Discharge Instructions     You were evaluated in the Emergency Department and after careful evaluation, we did not find any emergent condition requiring admission or further testing in the hospital.  Your exam/testing today was overall reassuring.  Symptoms seem to be due to hemorrhoids and/or anal fissure.  Continue taking your creams at home.  Continue taking your fiber and drinking your water.  Recommend over-the-counter MiraLAX to continue to soften your stools.  Please return to the Emergency Department if you experience any worsening of your condition.  Thank you for allowing [place] to be a part of your care.        Sabas Sous, MD 03/04/21 [id number]

## 2021-03-04 NOTE — ED Triage Notes (Signed)
Patient arrives complaining of rectal pain. Patient has known hemerrhoids and an anal fissure. Patient states prescriptions are not helping and he is still having pain with bowel movements.

## 2021-03-04 NOTE — Discharge Instructions (Addendum)
You were evaluated in the Emergency Department and after careful evaluation, we did not find any emergent condition requiring admission or further testing in the hospital.  Your exam/testing today was overall reassuring.  Symptoms seem to be due to hemorrhoids and/or anal fissure.  Continue taking your creams at home.  Continue taking your fiber and drinking your water.  Recommend over-the-counter MiraLAX to continue to soften your stools.  Please return to the Emergency Department if you experience any worsening of your condition.  Thank you for allowing [place] to be a part of your care.

## 2022-01-29 DIAGNOSIS — K648 Other hemorrhoids: Secondary | ICD-10-CM | POA: Diagnosis not present

## 2022-04-22 DIAGNOSIS — N400 Enlarged prostate without lower urinary tract symptoms: Secondary | ICD-10-CM | POA: Diagnosis not present

## 2022-04-22 DIAGNOSIS — K644 Residual hemorrhoidal skin tags: Secondary | ICD-10-CM | POA: Diagnosis not present

## 2022-04-22 DIAGNOSIS — Z720 Tobacco use: Secondary | ICD-10-CM | POA: Diagnosis not present

## 2022-04-22 DIAGNOSIS — I10 Essential (primary) hypertension: Secondary | ICD-10-CM | POA: Diagnosis not present

## 2022-04-22 DIAGNOSIS — Z905 Acquired absence of kidney: Secondary | ICD-10-CM | POA: Diagnosis not present

## 2022-04-22 DIAGNOSIS — M545 Low back pain, unspecified: Secondary | ICD-10-CM | POA: Diagnosis not present

## 2022-04-22 DIAGNOSIS — G8929 Other chronic pain: Secondary | ICD-10-CM | POA: Diagnosis not present

## 2022-04-22 DIAGNOSIS — K219 Gastro-esophageal reflux disease without esophagitis: Secondary | ICD-10-CM | POA: Diagnosis not present

## 2022-05-29 DIAGNOSIS — Z125 Encounter for screening for malignant neoplasm of prostate: Secondary | ICD-10-CM | POA: Diagnosis not present

## 2022-05-29 DIAGNOSIS — I10 Essential (primary) hypertension: Secondary | ICD-10-CM | POA: Diagnosis not present

## 2022-05-29 DIAGNOSIS — Z720 Tobacco use: Secondary | ICD-10-CM | POA: Diagnosis not present

## 2022-05-29 DIAGNOSIS — Z136 Encounter for screening for cardiovascular disorders: Secondary | ICD-10-CM | POA: Diagnosis not present

## 2022-05-29 DIAGNOSIS — Z1159 Encounter for screening for other viral diseases: Secondary | ICD-10-CM | POA: Diagnosis not present

## 2022-05-29 DIAGNOSIS — N400 Enlarged prostate without lower urinary tract symptoms: Secondary | ICD-10-CM | POA: Diagnosis not present

## 2022-05-29 DIAGNOSIS — K219 Gastro-esophageal reflux disease without esophagitis: Secondary | ICD-10-CM | POA: Diagnosis not present

## 2022-05-29 DIAGNOSIS — Z905 Acquired absence of kidney: Secondary | ICD-10-CM | POA: Diagnosis not present

## 2022-05-29 DIAGNOSIS — Z23 Encounter for immunization: Secondary | ICD-10-CM | POA: Diagnosis not present

## 2022-05-29 DIAGNOSIS — Z0000 Encounter for general adult medical examination without abnormal findings: Secondary | ICD-10-CM | POA: Diagnosis not present

## 2022-05-29 DIAGNOSIS — Z122 Encounter for screening for malignant neoplasm of respiratory organs: Secondary | ICD-10-CM | POA: Diagnosis not present

## 2022-05-29 DIAGNOSIS — K644 Residual hemorrhoidal skin tags: Secondary | ICD-10-CM | POA: Diagnosis not present

## 2022-05-30 ENCOUNTER — Other Ambulatory Visit: Payer: Self-pay | Admitting: Internal Medicine

## 2022-05-30 DIAGNOSIS — K219 Gastro-esophageal reflux disease without esophagitis: Secondary | ICD-10-CM | POA: Diagnosis not present

## 2022-05-30 DIAGNOSIS — Z905 Acquired absence of kidney: Secondary | ICD-10-CM | POA: Diagnosis not present

## 2022-05-30 DIAGNOSIS — Z125 Encounter for screening for malignant neoplasm of prostate: Secondary | ICD-10-CM | POA: Diagnosis not present

## 2022-05-30 DIAGNOSIS — Z1159 Encounter for screening for other viral diseases: Secondary | ICD-10-CM | POA: Diagnosis not present

## 2022-05-30 DIAGNOSIS — Z136 Encounter for screening for cardiovascular disorders: Secondary | ICD-10-CM

## 2022-05-30 DIAGNOSIS — I10 Essential (primary) hypertension: Secondary | ICD-10-CM | POA: Diagnosis not present

## 2022-05-30 DIAGNOSIS — N400 Enlarged prostate without lower urinary tract symptoms: Secondary | ICD-10-CM | POA: Diagnosis not present

## 2022-05-31 ENCOUNTER — Other Ambulatory Visit: Payer: Self-pay | Admitting: Internal Medicine

## 2022-05-31 DIAGNOSIS — Z720 Tobacco use: Secondary | ICD-10-CM

## 2022-05-31 DIAGNOSIS — Z122 Encounter for screening for malignant neoplasm of respiratory organs: Secondary | ICD-10-CM

## 2022-06-03 ENCOUNTER — Encounter: Payer: Self-pay | Admitting: Surgery

## 2022-06-03 ENCOUNTER — Other Ambulatory Visit: Payer: Self-pay

## 2022-06-03 ENCOUNTER — Ambulatory Visit: Payer: Self-pay

## 2022-06-03 ENCOUNTER — Ambulatory Visit (INDEPENDENT_AMBULATORY_CARE_PROVIDER_SITE_OTHER): Payer: No Typology Code available for payment source | Admitting: Surgery

## 2022-06-03 ENCOUNTER — Ambulatory Visit (INDEPENDENT_AMBULATORY_CARE_PROVIDER_SITE_OTHER): Payer: No Typology Code available for payment source | Admitting: Rehabilitative and Restorative Service Providers"

## 2022-06-03 ENCOUNTER — Encounter: Payer: Self-pay | Admitting: Rehabilitative and Restorative Service Providers"

## 2022-06-03 DIAGNOSIS — M545 Low back pain, unspecified: Secondary | ICD-10-CM | POA: Diagnosis not present

## 2022-06-03 DIAGNOSIS — M6281 Muscle weakness (generalized): Secondary | ICD-10-CM

## 2022-06-03 DIAGNOSIS — M5459 Other low back pain: Secondary | ICD-10-CM | POA: Diagnosis not present

## 2022-06-03 DIAGNOSIS — M4316 Spondylolisthesis, lumbar region: Secondary | ICD-10-CM | POA: Diagnosis not present

## 2022-06-03 DIAGNOSIS — R293 Abnormal posture: Secondary | ICD-10-CM

## 2022-06-03 DIAGNOSIS — R262 Difficulty in walking, not elsewhere classified: Secondary | ICD-10-CM

## 2022-06-03 DIAGNOSIS — M62830 Muscle spasm of back: Secondary | ICD-10-CM | POA: Diagnosis not present

## 2022-06-03 NOTE — Therapy (Signed)
OUTPATIENT PHYSICAL THERAPY EVALUATION   Patient Name: Scott Tate MRN: [id number] DOB:[birth year], 66 y.o., male Today's Date: 06/03/2022   PT End of Session - 06/03/22 1258     Visit Number 1    Number of Visits 20    Date for PT Re-Evaluation 08/12/22    Authorization Type [employer] $15 copay, no visit limit    PT Start Time 1302    PT Stop Time 1333    PT Time Calculation (min) 31 min    Activity Tolerance Patient tolerated treatment well    Behavior During Therapy WFL for tasks assessed/performed             Past Medical History:  Diagnosis Date   Hypertension    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   PCP: Ollen Bowl MD  REFERRING PROVIDER: Naida Sleight, PA-C  REFERRING DIAG: M54.50 (ICD-10-CM) - Low back pain, unspecified back pain laterality, unspecified chronicity, unspecified whether sciatica present M62.830 (ICD-10-CM) - Lumbar paraspinal muscle spasm  Rationale for Evaluation and Treatment Rehabilitation  THERAPY DIAG:  Other low back pain  Muscle weakness (generalized)  Difficulty in walking, not elsewhere classified  Abnormal posture  ONSET DATE: About a year   SUBJECTIVE:                                                                                                                                                                                           SUBJECTIVE STATEMENT:  Pt indicated complaints standing, transfers, sitting prolonged. Pt indicated insidious onset of symptoms.   Pt denied leg troubles.  Pt indicated no trouble sleeping due to symptoms.    PERTINENT HISTORY:  HTN,  Lumbar facet arthropathy trace L3 anterolisthesis per referral.   PAIN:  NPRS scale: at current 5/10.  at worst 7-8/10 Pain location: low back Pain description: nagging, achy Aggravating  factors: prolonged sitting > 15 mins, transfers, prolonged walking.  Relieving factors: patches help some   PRECAUTIONS: None  WEIGHT BEARING RESTRICTIONS No  FALLS:  Has patient fallen in last 6 months? 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   PLOF: Independent, working on cars, yard work  PATIENT GOALS  Have less pain with activity.    OBJECTIVE:   DIAGNOSTIC FINDINGS:  06/03/2022 Lumbar facet arthropathy trace L3 anterolisthesis  per referral.   PATIENT SURVEYS:  06/03/2022 FOTO intake: 59, predicted 68  SCREENING FOR RED FLAGS: 06/03/2022 Bowel or bladder incontinence: No Cauda equina syndrome: No   COGNITION: 06/03/2022  Overall cognitive status: Within functional limits for tasks assessed     SENSATION: 06/03/2022 [facility]  MUSCLE LENGTH: 06/03/2022 Passive supine SLR: Right 80 deg; Left 85 deg   POSTURE:  8/7/2023rounded shoulders, forward head, and decreased thoracic kyphosis  PALPATION: 06/03/2022 Tenderness and trigger points noted bilateral lumbar paraspinals L3-L5, tenderness to touch spinous process L3-L5  LUMBAR ROM:   Active  AROM  06/03/2022  Flexion To ankles without pain  Extension 75% WFL c ERP lumbar  Repeated 5 x in standing:  improved to 100 % ERP noted  Right lateral flexion To knee joint c ERP lumbar  Left lateral flexion To knee joint no complaints  Right rotation   Left rotation    (Blank rows = not tested)  LOWER EXTREMITY ROM:      Right 06/03/2022 Left 06/03/2022  Hip flexion    Hip extension    Hip abduction    Hip adduction    Hip internal rotation    Hip external rotation    Knee flexion    Knee extension    Ankle dorsiflexion    Ankle plantarflexion    Ankle inversion    Ankle eversion     (Blank rows = not tested)  LOWER EXTREMITY MMT:    MMT Right 06/03/2022 Left 06/03/2022   Hip flexion 5/5 5/5  Hip extension 4/5 c mild pain lumbar 5/5  Hip abduction    Hip adduction    Hip internal rotation    Hip external rotation    Knee flexion 5/5 5/5  Knee extension 5/5 5/5  Ankle dorsiflexion 5/5 5/5  Ankle plantarflexion    Ankle inversion    Ankle eversion     (Blank rows = not tested)  LUMBAR SPECIAL TESTS:  06/03/2022 (-) slump, crossed SLR bilateral  PAIVM lumbar:  concordant symptoms c hypomobility L4, L5 cPA, Rt and Lt uPA  FUNCTIONAL TESTS:  06/03/2022 18 inch chair sit to stand to sit s UE assist on 1st try, mild complaints of pain in back.  GAIT: 06/03/2022 Independent ambulation.  Mild forward trunk flexion    TODAY'S TREATMENT  06/03/2022:  Therex:   HEP instruction/performance c cues for techniques, handout provided.  Trial set performed of each for comprehension and symptom assessment.  See below for exercise list.   Manual:   Prone cPA g3 L1-L2, Rt and Lt uPA L4, L5 G3 for mobility gains.    PATIENT EDUCATION:  06/03/2022 Education details: HEP, POC, DN handout Person educated: Patient Education method: Explanation, Demonstration, Verbal cues, and Handouts Education comprehension: verbalized understanding, returned demonstration, and verbal cues required   HOME EXERCISE PROGRAM: Access Code: [email or web address] URL: [email or web address] Date: 06/03/2022 Prepared by: Chyrel Masson  Exercises - Supine Lower Trunk Rotation  - 2-3 x daily - 7 x weekly - 1 sets - 3-5 reps - 15 hold - Supine Bridge  - 1-2 x daily - 7 x weekly - 1-2 sets - 10 reps - 2 hold - Standing Lumbar Extension with Counter  - 3-5 x daily - 7 x weekly - 1 sets - 5-10 reps - Prone Press Up  - 1-2 x daily - 7 x weekly - 1 sets - 5-10 reps - 1-2 hold  ASSESSMENT:  CLINICAL IMPRESSION: Patient is a 66 y.o. who comes to clinic with  complaints of low back pain with mobility deficits primary that impair their ability to perform usual daily and recreational  functional activities without increase difficulty/symptoms at this time.  Patient to benefit from skilled PT services to address impairments and limitations to improve to previous level of function without restriction secondary to condition.    OBJECTIVE IMPAIRMENTS decreased activity tolerance, decreased coordination, decreased endurance, decreased mobility, difficulty walking, decreased ROM, decreased strength, increased fascial restrictions, impaired perceived functional ability, increased muscle spasms, impaired flexibility, improper body mechanics, postural dysfunction, and pain.   ACTIVITY LIMITATIONS carrying, lifting, bending, sitting, standing, squatting, transfers, bed mobility, and locomotion level  PARTICIPATION LIMITATIONS: meal prep, cleaning, interpersonal relationship, community activity, occupation, and yard work  PERSONAL FACTORS  nothing specific listed  are affecting patient's functional outcome.   REHAB POTENTIAL: Good  CLINICAL DECISION MAKING: Stable/uncomplicated  EVALUATION COMPLEXITY: Low   GOALS: Goals reviewed with patient? Yes  Short term PT Goals (target date for Short term goals are 3 weeks 06/24/2022) Patient will demonstrate independent use of home exercise program to maintain progress from in clinic treatments. Goal status: New   Long term PT goals (target dates for all long term goals are 10 weeks  08/12/2022 )  1. Patient will demonstrate/report pain at worst less than or equal to 2/10 to facilitate minimal limitation in daily activity secondary to pain symptoms. Goal status: New  2. Patient will demonstrate independent use of home exercise program to facilitate ability to maintain/progress functional gains from skilled physical therapy services. Goal status: New  3. Patient will demonstrate FOTO outcome > or = 68 % to indicate reduced disability due to condition. Goal status: New  4.  Patient will demonstrate lumbar extension 100 % WFL s  symptoms to facilitate upright standing, walking posture at PLOF s limitation Goal status: New  5.  Patient will demonstrate bilateral lumbar lateral flexion to knee joint s symptoms for mobility at PLOF.    Goal status: New  6.  Patient will demonstrate/report ability to tolerate standing, sitting > 1 hr for home and work tasks.   Goal status: New    PLAN: PT FREQUENCY: 1-2x/week  PT DURATION: 10 weeks  PLANNED INTERVENTIONS: Therapeutic exercises, Therapeutic activity, Neuro Muscular re-education, Balance training, Gait training, Patient/Family education, Joint mobilization, Stair training, DME instructions, Dry Needling, Electrical stimulation, Cryotherapy, Moist heat, Taping, Ultrasound, Ionotophoresis 4mg /ml Dexamethasone, and Manual therapy.  All included unless contraindicated.  PLAN FOR NEXT SESSION: Review HEP.  Manual therapy for lumbar mobility gains, possible dry needling per Pt. Preference.    , PT, DPT, OCS, ATC 06/03/22  1:39 PM

## 2022-06-03 NOTE — Progress Notes (Signed)
Office Visit Note   Patient: Scott Tate           Date of Birth: 12-Jan-1956           MRN: [id number] Visit Date: 06/03/2022              Requested by: Ollen Bowl, MD [address] [place],  [place] [address] PCP: Ollen Bowl, MD   Assessment & Plan: Visit Diagnoses:  1. Low back pain, unspecified back pain laterality, unspecified chronicity, unspecified whether sciatica present   2. Lumbar paraspinal muscle spasm     Plan: With patient's history of 1 kidney advised him somewhat limited as to what I can do today.  He has not been to take oral NSAIDs.  I would like to give him a prednisone taper but asked him to contact his primary care provider to see if this is okay for them to prescribe.  I will send him to formal PT and they were able to get him in this afternoon at 1 PM for dry needling.  Patient will follow-up with me in 4 weeks for recheck.  May consider lumbar MRI depending on how he is feeling.  Patient does have a few millimeters of L3 anterolisthesis seen on x-ray today.  Follow-Up Instructions: Return in about 4 weeks (around 07/01/2022) for With [facility] recheck lumbar.   Orders:  Orders Placed This Encounter  Procedures   XR Lumbar Spine 2-3 Views   Ambulatory referral to Physical Therapy   No orders of the defined types were placed in this encounter.     Procedures: No procedures performed   Clinical Data: No additional findings.   Subjective: Chief Complaint  Patient presents with   Lower Back - Pain    HPI 66 year old black male who is new patient to clinic comes in today with complaints of chronic low back pain.  Patient referred over by his primary care provider.  States that pain has been ongoing about 6 months.  Does not radiate into the lower extremities.  Low back pain and spasms aggravated when he is up on his feet for extended periods and also when he first gets up in the morning.  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  He is employed as a Public affairs consultant and this at times does also aggravate his back. Review of Systems No current complaints of cardiopulmonary GI/GU issues  Objective: Vital Signs: There were no vitals taken for this visit.  Physical Exam HENT:     Head: Normocephalic and atraumatic.     Nose: Nose normal.  Eyes:     Extraocular Movements: Extraocular movements intact.  Pulmonary:     Effort: No respiratory distress.  Abdominal:     General: There is no distension.  Musculoskeletal:     Comments: Gait is normal.  Pain with lumbar extension.  Lumbar flexion hands and knees with some discomfort.  Bilateral lumbar paraspinal tenderness/spasm.  Bilateral sciatic notch nontender.  Negative logroll bilateral hips.  Negative straight leg raise.  No focal motor deficits.  Neurological:     Mental Status: He is alert and oriented to person, place, and time.  Psychiatric:        Mood and Affect: Mood normal.     Ortho Exam  Specialty Comments:  No specialty comments available.  Imaging: No results found.   PMFS History: There are no problems to display for  this patient.  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

## 2022-06-10 ENCOUNTER — Telehealth: Payer: Self-pay | Admitting: Physical Therapy

## 2022-06-10 ENCOUNTER — Ambulatory Visit
Admission: RE | Admit: 2022-06-10 | Discharge: 2022-06-10 | Disposition: A | Discharge status: Home / Self Care | Payer: No Typology Code available for payment source | Source: Ambulatory Visit | Attending: Internal Medicine | Admitting: Internal Medicine

## 2022-06-10 ENCOUNTER — Encounter: Payer: No Typology Code available for payment source | Admitting: Physical Therapy

## 2022-06-10 DIAGNOSIS — Z136 Encounter for screening for cardiovascular disorders: Secondary | ICD-10-CM | POA: Diagnosis not present

## 2022-06-10 DIAGNOSIS — Z87891 Personal history of nicotine dependence: Secondary | ICD-10-CM | POA: Diagnosis not present

## 2022-06-10 NOTE — Telephone Encounter (Signed)
Spoke with pt who missed PT appt today.  He fell asleep.  Reminded of next scheduled PT appt.  Clarita Crane, PT, DPT 06/10/22 2:47 PM

## 2022-06-12 ENCOUNTER — Emergency Department (HOSPITAL_BASED_OUTPATIENT_CLINIC_OR_DEPARTMENT_OTHER)
Admission: EM | Admit: 2022-06-12 | Discharge: 2022-06-13 | Disposition: A | Discharge status: Home / Self Care | Payer: No Typology Code available for payment source | Source: Home / Self Care | Attending: Emergency Medicine | Admitting: Emergency Medicine

## 2022-06-12 ENCOUNTER — Encounter (HOSPITAL_BASED_OUTPATIENT_CLINIC_OR_DEPARTMENT_OTHER): Payer: Self-pay | Admitting: Emergency Medicine

## 2022-06-12 ENCOUNTER — Other Ambulatory Visit: Payer: Self-pay

## 2022-06-12 ENCOUNTER — Emergency Department (HOSPITAL_COMMUNITY)
Admission: EM | Admit: 2022-06-12 | Discharge: 2022-06-12 | Discharge status: Against Medical Advice | Payer: No Typology Code available for payment source | Attending: Emergency Medicine | Admitting: Emergency Medicine

## 2022-06-12 DIAGNOSIS — Z7951 Long term (current) use of inhaled steroids: Secondary | ICD-10-CM | POA: Insufficient documentation

## 2022-06-12 DIAGNOSIS — Z79899 Other long term (current) drug therapy: Secondary | ICD-10-CM | POA: Insufficient documentation

## 2022-06-12 DIAGNOSIS — R200 Anesthesia of skin: Secondary | ICD-10-CM | POA: Insufficient documentation

## 2022-06-12 DIAGNOSIS — M79642 Pain in left hand: Secondary | ICD-10-CM | POA: Insufficient documentation

## 2022-06-12 DIAGNOSIS — Y990 Civilian activity done for income or pay: Secondary | ICD-10-CM | POA: Diagnosis not present

## 2022-06-12 DIAGNOSIS — J45909 Unspecified asthma, uncomplicated: Secondary | ICD-10-CM | POA: Insufficient documentation

## 2022-06-12 DIAGNOSIS — I10 Essential (primary) hypertension: Secondary | ICD-10-CM | POA: Insufficient documentation

## 2022-06-12 DIAGNOSIS — Z5321 Procedure and treatment not carried out due to patient leaving prior to being seen by health care provider: Secondary | ICD-10-CM | POA: Diagnosis not present

## 2022-06-12 LAB — BASIC METABOLIC PANEL
Anion gap: 6 (ref 5–15)
BUN: 11 mg/dL (ref 8–23)
CO2: 26 mmol/L (ref 22–32)
Calcium: 9.8 mg/dL (ref 8.9–10.3)
Chloride: 108 mmol/L (ref 98–111)
Creatinine, Ser: 1.33 mg/dL — ABNORMAL HIGH (ref 0.61–1.24)
GFR, Estimated: 59 mL/min — ABNORMAL LOW (ref >60)
Glucose, Bld: 84 mg/dL (ref 70–99)
Potassium: 4.6 mmol/L (ref 3.5–5.1)
Sodium: 140 mmol/L (ref 135–145)

## 2022-06-12 LAB — CBC
HCT: 42.2 % (ref 39.0–52.0)
Hemoglobin: 13.8 g/dL (ref 13.0–17.0)
MCH: 29.3 pg (ref 26.0–34.0)
MCHC: 32.7 g/dL (ref 30.0–36.0)
MCV: 89.6 fL (ref 80.0–100.0)
Platelets: 292 10*3/uL (ref 150–400)
RBC: 4.71 MIL/uL (ref 4.22–5.81)
RDW: 13 % (ref 11.5–15.5)
WBC: 9.3 10*3/uL (ref 4.0–10.5)
nRBC: 0 % (ref 0.0–0.2)

## 2022-06-12 LAB — CK: Total CK: 428 U/L — ABNORMAL HIGH (ref 49–397)

## 2022-06-12 NOTE — ED Provider Triage Note (Signed)
Emergency Medicine Provider Triage Evaluation Note  Scott Tate , a 66 y.o. male  was evaluated in triage.  Pt complains of muscle cramps. He states that earlier today when he was at work his left hand and arm started cramping. He states that he tried eating mustard and drinking water but he couldn't get it to release. Denies any injuries, he was not working in a hot environment. Denies any history of same. States that it finally released right before he checked in today but feels like it could cramp again. Denies fevers, chills.  Review of Systems  Positive:  Negative:   Physical Exam  BP 116/67   Pulse (!) 54   Temp 98.7 F (37.1 C) (Oral)   Resp 16   SpO2 100%  Gen:   Awake, no distress   Resp:  Normal effort  MSK:   Moves extremities without difficulty  Other:  LUE ROM intact. No visualized abnormalities  Medical Decision Making  Medically screening exam initiated at 11:34 AM.  Appropriate orders placed.  Scott Tate was informed that the remainder of the evaluation will be completed by another provider, this initial triage assessment does not replace that evaluation, and the importance of remaining in the ED until their evaluation is complete.     Silva Bandy, PA-C 06/12/22 1137

## 2022-06-12 NOTE — ED Triage Notes (Signed)
Pt is c/o numbness in his left arm  Pt states he has cramping and tingling in his left hand  Pt states it started around 8am this morning  Pt went to [facility] around 10 am this morning and left because they still had not been seen  Pt states he did have blood work done there

## 2022-06-12 NOTE — ED Triage Notes (Signed)
Left hand pain and cramping since this morning moving up arm.  Ate mustard but continued to cramp

## 2022-06-12 NOTE — ED Notes (Signed)
Pt left AMA °

## 2022-06-13 MED ORDER — PREDNISONE 10 MG PO TABS: 20.0000 mg | ORAL_TABLET | Freq: Two times a day (BID) | ORAL | 0 refills | Status: DC

## 2022-06-13 MED ORDER — CYCLOBENZAPRINE HCL 10 MG PO TABS: 10.0000 mg | ORAL_TABLET | Freq: Three times a day (TID) | ORAL | 0 refills | Status: AC | PRN

## 2022-06-13 MED ORDER — CYCLOBENZAPRINE HCL 10 MG PO TABS
10.0000 mg | ORAL_TABLET | Freq: Once | ORAL | Status: AC
Start: 2022-06-13 — End: 2022-06-13
  Administered 2022-06-13: 10 mg via ORAL
  Filled 2022-06-13: qty 1

## 2022-06-13 MED ORDER — PREDNISONE 20 MG PO TABS
20.0000 mg | ORAL_TABLET | Freq: Once | ORAL | Status: AC
Start: 2022-06-13 — End: 2022-06-13
  Administered 2022-06-13: 20 mg via ORAL
  Filled 2022-06-13: qty 1

## 2022-06-13 NOTE — Discharge Instructions (Signed)
Begin taking prednisone as prescribed.  Begin taking Flexeril as prescribed as needed for pain.  Follow-up with your primary doctor if not improving in the next week.

## 2022-06-13 NOTE — ED Provider Notes (Signed)
[facility] EMERGENCY DEPARTMENT Provider Note   CSN: [id number] Arrival date & time: 06/12/22  2331     History  Chief Complaint  Patient presents with   Numbness    Scott Tate is a 66 y.o. male.  Patient is a 66 year old male with past medical history of asthma, hypertension.  Patient presenting today with complaints of left hand pain.  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  He felt as though these 3 fingers "locked up".  This feeling radiated up his left arm.  He originally went to [facility], but left there due to extended wait time.  He did have laboratory studies obtained, but left prior to being seen.  He denies any headache, neck pain, or weakness.  The history is provided by the patient.       Home Medications Prior to Admission medications   Medication Sig Start Date End Date Taking? Authorizing Provider  albuterol (VENTOLIN HFA) 108 (90 Base) MCG/ACT inhaler Inhale 2 puffs into the lungs every 6 (six) hours as needed for wheezing or shortness of breath.    [provider]  cyclobenzaprine (FLEXERIL) 5 MG tablet Take 1 tablet (5 mg total) by mouth 3 (three) times daily as needed. Patient taking differently: Take 5 mg by mouth 3 (three) times daily as needed for muscle spasms. 02/03/21   Charlynne Pander, MD  docusate sodium (COLACE) 250 MG capsule Take 1 capsule (250 mg total) by mouth daily. 02/23/21   Derwood Kaplan, MD  lidocaine (XYLOCAINE) 2 % jelly Apply 1 application topically 3 (three) times daily as needed. Max use for 7 days 02/23/21   Derwood Kaplan, MD  Lidocaine, Anorectal, 5 % CREA Apply 1 application topically daily as needed (pain).    [provider]  losartan (COZAAR) 25 MG tablet Take 25 mg by mouth daily. 09/27/20   [provider]  omeprazole (PRILOSEC) 20 MG capsule Take 20 mg by mouth  daily. 02/08/21   [provider]  Pramox-PE-Glycerin-Petrolatum (PREPARATION H) 1-0.25-14.4-15 % CREA Apply 1 application topically 3 (three) times daily.    [provider]  predniSONE (DELTASONE) 20 MG tablet Take 60 mg daily x 2 days then 40 mg daily x 2 days then 20 mg daily x 2 days Patient taking differently: Take 20 mg by mouth See admin instructions. Take 60 mg daily x 2 days then 40 mg daily x 2 days then 20 mg daily x 2 days 02/03/21   Charlynne Pander, MD  tamsulosin (FLOMAX) 0.4 MG CAPS capsule Take 0.4 mg by mouth daily. 07/14/20   [provider]  traMADol (ULTRAM) 50 MG tablet Take 50 mg by mouth every 6 (six) hours as needed for severe pain (pain). 10/17/20   [provider]  sucralfate (CARAFATE) 1 GM/10ML suspension Take 10 mLs (1 g total) by mouth 4 (four) times daily -  with meals and at bedtime. Patient not taking: Reported on 08/22/2018 04/19/18 05/28/20  Jacinto Halim, PA-C      Allergies    Patient has no known allergies.    Review of Systems   Review of Systems  All other systems reviewed and are negative.   Physical Exam Updated Vital Signs BP (!) 141/73 (BP Location: Right Arm)   Pulse (!) 52   Temp 97.8 F (36.6 C) (Oral)   Resp 18  Ht 5' 6.5" (1.689 m)   Wt 68.5 kg   SpO2 100%   BMI 24.01 kg/m  Physical Exam Vitals and nursing note reviewed.  Constitutional:      Appearance: Normal appearance.  HENT:     Head: Normocephalic and atraumatic.  Pulmonary:     Effort: Pulmonary effort is normal.  Musculoskeletal:     Comments: Left upper extremity is grossly normal in appearance.  Ulnar and radial pulses are easily palpable.  He is able to flex, extend, and oppose all fingers and sensation is intact throughout the entire hand.  Skin:    General: Skin is warm and dry.  Neurological:     Mental Status: He is alert.     ED Results / Procedures / Treatments   Labs (all labs ordered are listed, but only abnormal  results are displayed) Labs Reviewed - No data to display  EKG None  Radiology No results found.  Procedures Procedures    Medications Ordered in ED Medications  predniSONE (DELTASONE) tablet 20 mg (has no administration in time range)  cyclobenzaprine (FLEXERIL) tablet 10 mg (has no administration in time range)    ED Course/ Medical Decision Making/ A&P  Patient presenting with complaints of a "locking up" of 3 fingers of his left hand.  Whether this represents a carpal tunnel type situation or overuse situation, I am uncertain but highly doubt stroke.  Patient to be treated with prednisone and Flexeril.  He is to rest and follow-up as needed if not improving.  Final Clinical Impression(s) / ED Diagnoses Final diagnoses:  None    Rx / DC Orders ED Discharge Orders     None         Geoffery Lyons, MD 06/13/22 [id number]

## 2022-06-18 ENCOUNTER — Encounter: Payer: No Typology Code available for payment source | Admitting: Rehabilitative and Restorative Service Providers"

## 2022-06-18 ENCOUNTER — Telehealth: Payer: Self-pay | Admitting: Rehabilitative and Restorative Service Providers"

## 2022-06-18 NOTE — Telephone Encounter (Signed)
Called patient after 15 mins no show for appointment today.  Tried to leave message but connection was lost.   Chyrel Masson, PT, DPT, OCS, ATC 06/18/22  1:20 PM

## 2022-06-25 ENCOUNTER — Encounter: Payer: Self-pay | Admitting: Rehabilitative and Restorative Service Providers"

## 2022-06-25 ENCOUNTER — Ambulatory Visit (INDEPENDENT_AMBULATORY_CARE_PROVIDER_SITE_OTHER): Payer: No Typology Code available for payment source | Admitting: Rehabilitative and Restorative Service Providers"

## 2022-06-25 DIAGNOSIS — R262 Difficulty in walking, not elsewhere classified: Secondary | ICD-10-CM

## 2022-06-25 DIAGNOSIS — R293 Abnormal posture: Secondary | ICD-10-CM | POA: Diagnosis not present

## 2022-06-25 DIAGNOSIS — M6281 Muscle weakness (generalized): Secondary | ICD-10-CM | POA: Diagnosis not present

## 2022-06-25 DIAGNOSIS — M5459 Other low back pain: Secondary | ICD-10-CM | POA: Diagnosis not present

## 2022-06-25 NOTE — Therapy (Addendum)
OUTPATIENT PHYSICAL THERAPY TREATMENT /DISCHARGE   Patient Name: Scott Tate MRN: [id number] DOB:24-Oct-1956, 66 y.o., male Today's Date: 06/25/2022  PCP: Mckinley Jewel MD  REFERRING PROVIDER: Lanae Crumbly, PA-C  END OF SESSION:   PT End of Session - 06/25/22 1300     Visit Number 2    Number of Visits 20    Date for PT Re-Evaluation 08/12/22    Authorization Type [employer] $15 copay, no visit limit    PT Start Time 1300    PT Stop Time 1328    PT Time Calculation (min) 28 min    Activity Tolerance Patient tolerated treatment well    Behavior During Therapy WFL for tasks assessed/performed              Past Medical History:  Diagnosis Date   Hypertension    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   REFERRING DIAG: M54.50 (ICD-10-CM) - Low back pain, unspecified back pain laterality, unspecified chronicity, unspecified whether sciatica present M62.830 (ICD-10-CM) - Lumbar paraspinal muscle spasm  Rationale for Evaluation and Treatment Rehabilitation  THERAPY DIAG:  Other low back pain  Muscle weakness (generalized)  Difficulty in walking, not elsewhere classified  Abnormal posture  ONSET DATE: About a year   SUBJECTIVE:                                                                                                                                                                                           SUBJECTIVE STATEMENT: Pt indicated feeling better overall but still having increases in pain while working.   Pt indicated overall improvement to normal about 80%.    PERTINENT HISTORY:  HTN,  Lumbar facet arthropathy trace L3 anterolisthesis per referral.   PAIN:  NPRS scale: at worst in last week 6.5-7/10.    Pain location: low back Pain description: nagging, achy Aggravating factors:  prolonged standing Relieving factors: patches help some   PRECAUTIONS: None  WEIGHT BEARING RESTRICTIONS No  FALLS:  Has patient fallen in last 6 months? 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   PLOF: Independent, working on cars, yard work  PATIENT GOALS  Have less pain with activity.    OBJECTIVE:   DIAGNOSTIC FINDINGS:  06/03/2022 Lumbar facet arthropathy trace L3 anterolisthesis per referral.   PATIENT  SURVEYS:  06/25/2022 : FOTO update:  63  06/03/2022 FOTO intake: 59, predicted 68  SCREENING FOR RED FLAGS: 06/03/2022 Bowel or bladder incontinence: No Cauda equina syndrome: No  COGNITION: 06/03/2022  Overall cognitive status: Within functional limits for tasks assessed     SENSATION: 06/03/2022 [facility]  MUSCLE LENGTH: 06/03/2022 Passive supine SLR: Right 80 deg; Left 85 deg   POSTURE:  8/7/2023rounded shoulders, forward head, and decreased thoracic kyphosis  PALPATION: 06/03/2022 Tenderness and trigger points noted bilateral lumbar paraspinals L3-L5, tenderness to touch spinous process L3-L5  LUMBAR ROM:   Active  AROM  06/03/2022 AROM 06/25/2022  Flexion To ankles without pain   Extension 75% WFL c ERP lumbar  Repeated 5 x in standing:  improved to 100 % ERP noted 100 % without complaints  Right lateral flexion To knee joint c ERP lumbar Movement to knee joint with slight pain   Left lateral flexion To knee joint no complaints   Right rotation    Left rotation     (Blank rows = not tested)  LOWER EXTREMITY ROM:      Right 06/03/2022 Left 06/03/2022  Hip flexion    Hip extension    Hip abduction    Hip adduction    Hip internal rotation    Hip external rotation    Knee flexion    Knee extension    Ankle dorsiflexion    Ankle plantarflexion    Ankle inversion    Ankle eversion     (Blank rows = not  tested)  LOWER EXTREMITY MMT:    MMT Right 06/03/2022 Left 06/03/2022  Hip flexion 5/5 5/5  Hip extension 4/5 c mild pain lumbar 5/5  Hip abduction    Hip adduction    Hip internal rotation    Hip external rotation    Knee flexion 5/5 5/5  Knee extension 5/5 5/5  Ankle dorsiflexion 5/5 5/5  Ankle plantarflexion    Ankle inversion    Ankle eversion     (Blank rows = not tested)  LUMBAR SPECIAL TESTS:  06/03/2022 (-) slump, crossed SLR bilateral  PAIVM lumbar:  concordant symptoms c hypomobility L4, L5 cPA, Rt and Lt uPA  FUNCTIONAL TESTS:  06/03/2022 18 inch chair sit to stand to sit s UE assist on 1st try, mild complaints of pain in back.  GAIT: 06/03/2022 Independent ambulation.  Mild forward trunk flexion   TODAY'S TREATMENT  06/25/2022: Therex:  Sidelying lumbar rotation stretch 15 sec x 5 bilateral Supine bridge 3 sec hold x 15, x 5 Standing lumbar extension x 5  Review of existing HEP for knowledge improvements, continued education/cues for routine use and consistency.   Manual: Lt sidelying regional rotation g3 lumbar mobilizations  06/03/2022:  Therex:   HEP instruction/performance c cues for techniques, handout provided.  Trial set performed of each for comprehension and symptom assessment.  See below for exercise list.   Manual:   Prone cPA g3 L1-L2, Rt and Lt uPA L4, L5 G3 for mobility gains.    PATIENT EDUCATION:  06/03/2022 Education details: HEP update Person educated: Patient Education method: Consulting civil engineer, Demonstration, Verbal cues, and Handouts Education comprehension: verbalized understanding, returned demonstration, and verbal cues required   HOME EXERCISE PROGRAM: Access Code: [email or web address] URL: [email or web address] Date: 06/25/2022 Prepared by: Scot Jun  Exercises - Supine Lower Trunk Rotation  - 2-3 x daily - 7 x weekly - 1 sets - 3-5 reps - 15 hold - Supine Bridge  - 1-2 x daily - 7 x  weekly - 1-2 sets - 10 reps - 2  hold - Standing Lumbar Extension with Counter  - 3-5 x daily - 7 x weekly - 1 sets - 5-10 reps - Prone Press Up  - 1-2 x daily - 7 x weekly - 1 sets - 5-10 reps - 1-2 hold - Sidelying Lumbar Rotation Stretch  - 1-2 x daily - 7 x weekly - 1 sets - 3-5 reps - 15 hold  ASSESSMENT:  CLINICAL IMPRESSION: Improvements in lumbar mobility noted and Pt report improvements noted as well.  Continued presence of symptoms at times, primarily after prolonged standing at work.  Continued emphasis on routine HEP and skilled PT services to address restrictions.    OBJECTIVE IMPAIRMENTS decreased activity tolerance, decreased coordination, decreased endurance, decreased mobility, difficulty walking, decreased ROM, decreased strength, increased fascial restrictions, impaired perceived functional ability, increased muscle spasms, impaired flexibility, improper body mechanics, postural dysfunction, and pain.   ACTIVITY LIMITATIONS carrying, lifting, bending, sitting, standing, squatting, transfers, bed mobility, and locomotion level  PARTICIPATION LIMITATIONS: meal prep, cleaning, interpersonal relationship, community activity, occupation, and yard work  PERSONAL FACTORS  nothing specific listed  are affecting patient's functional outcome.   REHAB POTENTIAL: Good  CLINICAL DECISION MAKING: Stable/uncomplicated  EVALUATION COMPLEXITY: Low   GOALS: Goals reviewed with patient? Yes  Short term PT Goals (target date for Short term goals are 3 weeks 06/24/2022) Patient will demonstrate independent use of home exercise program to maintain progress from in clinic treatments. Goal status:  partially met - 06/25/2022   Long term PT goals (target dates for all long term goals are 10 weeks  08/12/2022 )  1. Patient will demonstrate/report pain at worst less than or equal to 2/10 to facilitate minimal limitation in daily activity secondary to pain symptoms. Goal status: on going - 06/25/2022  2. Patient will  demonstrate independent use of home exercise program to facilitate ability to maintain/progress functional gains from skilled physical therapy services. Goal status: on going - 06/25/2022  3. Patient will demonstrate FOTO outcome > or = 68 % to indicate reduced disability due to condition. Goal status: on going - 06/25/2022  4.  Patient will demonstrate lumbar extension 100 % WFL s symptoms to facilitate upright standing, walking posture at PLOF s limitation Goal status: on going - 06/25/2022  5.  Patient will demonstrate bilateral lumbar lateral flexion to knee joint s symptoms for mobility at PLOF.    Goal status: on going - 06/25/2022  6.  Patient will demonstrate/report ability to tolerate standing, sitting > 1 hr for home and work tasks.   Goal status: on going - 06/25/2022    PLAN: PT FREQUENCY: 1-2x/week  PT DURATION: 10 weeks  PLANNED INTERVENTIONS: Therapeutic exercises, Therapeutic activity, Neuro Muscular re-education, Balance training, Gait training, Patient/Family education, Joint mobilization, Stair training, DME instructions, Dry Needling, Electrical stimulation, Cryotherapy, Moist heat, Taping, Ultrasound, Ionotophoresis 4mg /ml Dexamethasone, and Manual therapy.  All included unless contraindicated.  PLAN FOR NEXT SESSION: Review HEP.  Manual therapy for lumbar mobility gains as necessary.  Possible HEP transitioning?  Scot Jun, PT, DPT, OCS, ATC 06/25/22  1:29 PM   PHYSICAL THERAPY DISCHARGE SUMMARY  Visits from Start of Care: 2  Current functional level related to goals / functional outcomes: See note   Remaining deficits: See note   Education / Equipment: HEP  Patient goals were partially met. Patient is being discharged due to not returning since the last visit.  Scot Jun, PT, DPT, OCS, ATC 08/06/22  1:42 PM

## 2022-06-27 ENCOUNTER — Inpatient Hospital Stay: Admission: RE | Admit: 2022-06-27 | Payer: No Typology Code available for payment source | Source: Ambulatory Visit

## 2022-07-02 ENCOUNTER — Encounter: Payer: No Typology Code available for payment source | Admitting: Rehabilitative and Restorative Service Providers"

## 2022-07-02 ENCOUNTER — Telehealth: Payer: Self-pay | Admitting: Rehabilitative and Restorative Service Providers"

## 2022-07-02 NOTE — Telephone Encounter (Signed)
Called patient after 15 mins no show for appointment today.  Left message about no show, as well as call back number.  No additional visits scheduled at this time.   Chyrel Masson, PT, DPT, OCS, ATC 07/02/22  1:25 PM

## 2022-07-19 ENCOUNTER — Inpatient Hospital Stay: Admission: RE | Admit: 2022-07-19 | Payer: No Typology Code available for payment source | Source: Ambulatory Visit

## 2022-07-23 ENCOUNTER — Emergency Department (HOSPITAL_BASED_OUTPATIENT_CLINIC_OR_DEPARTMENT_OTHER)
Admission: EM | Admit: 2022-07-23 | Discharge: 2022-07-23 | Disposition: A | Discharge status: Home / Self Care | Payer: No Typology Code available for payment source | Attending: Emergency Medicine | Admitting: Emergency Medicine

## 2022-07-23 ENCOUNTER — Other Ambulatory Visit: Payer: Self-pay

## 2022-07-23 ENCOUNTER — Encounter (HOSPITAL_BASED_OUTPATIENT_CLINIC_OR_DEPARTMENT_OTHER): Payer: Self-pay | Admitting: Emergency Medicine

## 2022-07-23 DIAGNOSIS — T310 Burns involving less than 10% of body surface: Secondary | ICD-10-CM | POA: Insufficient documentation

## 2022-07-23 DIAGNOSIS — W868XXA Exposure to other electric current, initial encounter: Secondary | ICD-10-CM | POA: Insufficient documentation

## 2022-07-23 DIAGNOSIS — X19XXXA Contact with other heat and hot substances, initial encounter: Secondary | ICD-10-CM | POA: Diagnosis not present

## 2022-07-23 DIAGNOSIS — T23122A Burn of first degree of single left finger (nail) except thumb, initial encounter: Secondary | ICD-10-CM | POA: Diagnosis not present

## 2022-07-23 DIAGNOSIS — T300 Burn of unspecified body region, unspecified degree: Secondary | ICD-10-CM

## 2022-07-23 MED ORDER — BACITRACIN ZINC 500 UNIT/GM EX OINT
TOPICAL_OINTMENT | Freq: Two times a day (BID) | CUTANEOUS | Status: DC
  Administered 2022-07-23: 1 via TOPICAL
  Filled 2022-07-23: qty 28.35

## 2022-07-23 NOTE — ED Provider Notes (Signed)
[facility] EMERGENCY DEPARTMENT Provider Note   CSN: [id number] Arrival date & time: 07/23/22  2105     History  Chief Complaint  Patient presents with   Burn    Scott Tate is a 66 y.o. male.  Who presents ED for evaluation of superficial burn to the left ring finger.  States he was wearing a ring on the finger when he touched the positive end of the battery terminal and immediately felt a shock.  States the area felt warm afterwards.  Was painful to take the ring off.  Currently complaining of mild pain.  No numbness or tingling.   Burn      Home Medications Prior to Admission medications   Medication Sig Start Date End Date Taking? Authorizing Provider  albuterol (VENTOLIN HFA) 108 (90 Base) MCG/ACT inhaler Inhale 2 puffs into the lungs every 6 (six) hours as needed for wheezing or shortness of breath.    [provider]  cyclobenzaprine (FLEXERIL) 10 MG tablet Take 1 tablet (10 mg total) by mouth 3 (three) times daily as needed for muscle spasms. 06/13/22   Geoffery Lyons, MD  docusate sodium (COLACE) 250 MG capsule Take 1 capsule (250 mg total) by mouth daily. 02/23/21   Derwood Kaplan, MD  lidocaine (XYLOCAINE) 2 % jelly Apply 1 application topically 3 (three) times daily as needed. Max use for 7 days 02/23/21   Derwood Kaplan, MD  Lidocaine, Anorectal, 5 % CREA Apply 1 application topically daily as needed (pain).    [provider]  losartan (COZAAR) 25 MG tablet Take 25 mg by mouth daily. 09/27/20   [provider]  omeprazole (PRILOSEC) 20 MG capsule Take 20 mg by mouth daily. 02/08/21   [provider]  Pramox-PE-Glycerin-Petrolatum (PREPARATION H) 1-0.25-14.4-15 % CREA Apply 1 application topically 3 (three) times daily.    [provider]  predniSONE (DELTASONE) 10 MG tablet Take 2 tablets (20 mg total) by mouth 2 (two) times daily. 06/13/22   Geoffery Lyons, MD  tamsulosin (FLOMAX) 0.4 MG CAPS capsule Take 0.4 mg by  mouth daily. 07/14/20   [provider]  traMADol (ULTRAM) 50 MG tablet Take 50 mg by mouth every 6 (six) hours as needed for severe pain (pain). 10/17/20   [provider]  sucralfate (CARAFATE) 1 GM/10ML suspension Take 10 mLs (1 g total) by mouth 4 (four) times daily -  with meals and at bedtime. Patient not taking: Reported on 08/22/2018 04/19/18 05/28/20  Jacinto Halim, PA-C      Allergies    Patient has no known allergies.    Review of Systems   Review of Systems  Skin:  Positive for wound.  All other systems reviewed and are negative.   Physical Exam Updated Vital Signs BP (!) 142/76 (BP Location: Right Arm)   Pulse 72   Temp 98.4 F (36.9 C) (Oral)   Resp 18   Ht 5\' 5"  (1.651 m)   Wt 70.8 kg   SpO2 98%   BMI 25.96 kg/m  Physical Exam Vitals and nursing note reviewed.  Constitutional:      General: He is not in acute distress.    Appearance: Normal appearance. He is normal weight. He is not ill-appearing.  HENT:     Head: Normocephalic and atraumatic.  Pulmonary:     Effort: Pulmonary effort is normal. No respiratory distress.  Abdominal:     General: Abdomen is flat.  Musculoskeletal:  General: Normal range of motion.     Cervical back: Neck supple.  Skin:    General: Skin is warm and dry.     Comments: Small superficial burns to the dorsum and volar aspect of the left ring finger in a ring distribution.  Neurological:     Mental Status: He is alert and oriented to person, place, and time.  Psychiatric:        Mood and Affect: Mood normal.        Behavior: Behavior normal.     ED Results / Procedures / Treatments   Labs (all labs ordered are listed, but only abnormal results are displayed) Labs Reviewed - No data to display  EKG None  Radiology No results found.  Procedures Procedures    Medications Ordered in ED Medications  bacitracin ointment (has no administration in time range)    ED Course/ Medical Decision  Making/ A&P                           Medical Decision Making Risk OTC drugs.  This patient presents to the ED for concern of superficial burn to the left ring finger   Additional history obtained from: Nursing notes from this visit.   Afebrile, hemodynamically stable.  Patient is a 66 year old male who presents ED for evaluation of superficial burns intervene distribution on his left ring finger.  Was wearing a ring tested positive terminal over the injury.  Physical exam shows small blisters on the dorsum and volar aspect of the left ring finger in a ring distribution.  Patient was given bacitracin ointment and sterile gauze wrap while in the ED.  Advised him to pick up Neosporin and apply it daily to the affected area.  Gave patient wound care instructions.  Given return precautions.  Stable at discharge.  At this time there does not appear to be any evidence of an acute emergency medical condition and the patient appears stable for discharge with appropriate outpatient follow up. Diagnosis was discussed with patient who verbalizes understanding of care plan and is agreeable to discharge. I have discussed return precautions with patient who verbalizes understanding. Patient encouraged to follow-up with their PCP within 1 week. All questions answered.  Patient's case discussed with Dr. Laverta Baltimore who agrees with plan to discharge with follow-up.   Note: Portions of this report may have been transcribed using voice recognition software. Every effort was made to ensure accuracy; however, inadvertent computerized transcription errors may still be present.          Final Clinical Impression(s) / ED Diagnoses Final diagnoses:  None    Rx / DC Orders ED Discharge Orders     None         Roylene Reason, Hershal Coria 07/23/22 2350    Margette Fast, MD 07/29/22 1204

## 2022-07-23 NOTE — ED Triage Notes (Signed)
Pt reports shock from car battery terminal to LT ring finger; burn consistent with where ring was noted to LT ring finger

## 2022-07-23 NOTE — Discharge Instructions (Signed)
You have been seen today for your complaint of superficial burn. Your discharge medications include Neosporin.  This is an antibiotic ointment that she can pick up over-the-counter.  You should apply it to the affected area at dressing changes. Home care instructions are as follows:  You should keep the area clean and dry for 24 hours.  You may clean with soapy water daily after that.  You should wrap the affected area. Follow up with: Your primary care provider in 1 week Please seek immediate medical care if you develop any of the following symptoms: You have more fluid, blood, or pus coming from your burn. You have red streaks near the burn. You have very bad pain. At this time there does not appear to be the presence of an emergent medical condition, however there is always the potential for conditions to change. Please read and follow the below instructions.  Do not take your medicine if  develop an itchy rash, swelling in your mouth or lips, or difficulty breathing; call 911 and seek immediate emergency medical attention if this occurs.  You may review your lab tests and imaging results in their entirety on your MyChart account.  Please discuss all results of fully with your primary care provider and other specialist at your follow-up visit.  Note: Portions of this text may have been transcribed using voice recognition software. Every effort was made to ensure accuracy; however, inadvertent computerized transcription errors may still be present.

## 2022-08-02 ENCOUNTER — Emergency Department (HOSPITAL_BASED_OUTPATIENT_CLINIC_OR_DEPARTMENT_OTHER)
Admission: EM | Admit: 2022-08-02 | Discharge: 2022-08-02 | Disposition: A | Discharge status: Home / Self Care | Payer: No Typology Code available for payment source | Attending: Emergency Medicine | Admitting: Emergency Medicine

## 2022-08-02 ENCOUNTER — Other Ambulatory Visit: Payer: Self-pay

## 2022-08-02 ENCOUNTER — Emergency Department (HOSPITAL_BASED_OUTPATIENT_CLINIC_OR_DEPARTMENT_OTHER): Payer: No Typology Code available for payment source

## 2022-08-02 ENCOUNTER — Encounter (HOSPITAL_BASED_OUTPATIENT_CLINIC_OR_DEPARTMENT_OTHER): Payer: Self-pay | Admitting: Emergency Medicine

## 2022-08-02 DIAGNOSIS — B349 Viral infection, unspecified: Secondary | ICD-10-CM | POA: Diagnosis not present

## 2022-08-02 DIAGNOSIS — R519 Headache, unspecified: Secondary | ICD-10-CM | POA: Diagnosis not present

## 2022-08-02 DIAGNOSIS — U071 COVID-19: Secondary | ICD-10-CM | POA: Diagnosis not present

## 2022-08-02 DIAGNOSIS — Z79899 Other long term (current) drug therapy: Secondary | ICD-10-CM | POA: Insufficient documentation

## 2022-08-02 DIAGNOSIS — I10 Essential (primary) hypertension: Secondary | ICD-10-CM | POA: Diagnosis not present

## 2022-08-02 DIAGNOSIS — Z20822 Contact with and (suspected) exposure to covid-19: Secondary | ICD-10-CM | POA: Diagnosis not present

## 2022-08-02 LAB — CBC WITH DIFFERENTIAL/PLATELET
Abs Immature Granulocytes: 0.01 10*3/uL (ref 0.00–0.07)
Basophils Absolute: 0.1 10*3/uL (ref 0.0–0.1)
Basophils Relative: 1 %
Eosinophils Absolute: 0.2 10*3/uL (ref 0.0–0.5)
Eosinophils Relative: 2 %
HCT: 37.1 % — ABNORMAL LOW (ref 39.0–52.0)
Hemoglobin: 12.6 g/dL — ABNORMAL LOW (ref 13.0–17.0)
Immature Granulocytes: 0 %
Lymphocytes Relative: 23 %
Lymphs Abs: 2.2 10*3/uL (ref 0.7–4.0)
MCH: 29.6 pg (ref 26.0–34.0)
MCHC: 34 g/dL (ref 30.0–36.0)
MCV: 87.3 fL (ref 80.0–100.0)
Monocytes Absolute: 0.8 10*3/uL (ref 0.1–1.0)
Monocytes Relative: 8 %
Neutro Abs: 6.2 10*3/uL (ref 1.7–7.7)
Neutrophils Relative %: 66 %
Platelets: 272 10*3/uL (ref 150–400)
RBC: 4.25 MIL/uL (ref 4.22–5.81)
RDW: 13 % (ref 11.5–15.5)
WBC: 9.4 10*3/uL (ref 4.0–10.5)
nRBC: 0 % (ref 0.0–0.2)

## 2022-08-02 LAB — BASIC METABOLIC PANEL
Anion gap: 6 (ref 5–15)
BUN: 18 mg/dL (ref 8–23)
CO2: 25 mmol/L (ref 22–32)
Calcium: 8.2 mg/dL — ABNORMAL LOW (ref 8.9–10.3)
Chloride: 108 mmol/L (ref 98–111)
Creatinine, Ser: 1.33 mg/dL — ABNORMAL HIGH (ref 0.61–1.24)
GFR, Estimated: 58 mL/min — ABNORMAL LOW (ref >60)
Glucose, Bld: 116 mg/dL — ABNORMAL HIGH (ref 70–99)
Potassium: 4 mmol/L (ref 3.5–5.1)
Sodium: 139 mmol/L (ref 135–145)

## 2022-08-02 LAB — SARS CORONAVIRUS 2 BY RT PCR: SARS Coronavirus 2 by RT PCR: NEGATIVE

## 2022-08-02 NOTE — ED Triage Notes (Signed)
Pt states he has had a mild headache today and has been around his daughter that tested positive for covid  Pt requesting to be tested

## 2022-08-02 NOTE — Discharge Instructions (Signed)
Your COVID test is negative.   Your labs were unremarkable today.  Take Tylenol or Motrin if you have chills.  See your doctor for follow-up  If you have persistent fever or worsening cough then I recommend retesting for COVID  Return to ER if you have trouble breathing, severe headache, vomiting

## 2022-08-02 NOTE — ED Notes (Signed)
Pt agreeable with d/c plan as discussed by provider- this nurse has verbally reinforced d/c instructions and provided pt with written copy - pt acknowledges verbal understanding and denies any addl questions, concerns, needs- ambulatory independently at d/c with steady gait; vitals stable; no distress.  

## 2022-08-02 NOTE — ED Provider Notes (Signed)
[facility] EMERGENCY DEPARTMENT Provider Note   CSN: [id number] Arrival date & time: 08/02/22  2026     History  Chief Complaint  Patient presents with   Headache    Scott Tate is a 66 y.o. male history of hypertension, kidney donor, here presenting with headache and chills.  He was with [place] all weekend [place] the beach.  His daughter tested positive for COVID 3 days ago.  He started having mild headache today and some chills.  Also has nonproductive cough.  He is concerned that he may have COVID.  The history is provided by the patient.       Home Medications Prior to Admission medications   Medication Sig Start Date End Date Taking? Authorizing Provider  albuterol (VENTOLIN HFA) 108 (90 Base) MCG/ACT inhaler Inhale 2 puffs into the lungs every 6 (six) hours as needed for wheezing or shortness of breath.    [provider]  cyclobenzaprine (FLEXERIL) 10 MG tablet Take 1 tablet (10 mg total) by mouth 3 (three) times daily as needed for muscle spasms. 06/13/22   Geoffery Lyons, MD  docusate sodium (COLACE) 250 MG capsule Take 1 capsule (250 mg total) by mouth daily. 02/23/21   Derwood Kaplan, MD  lidocaine (XYLOCAINE) 2 % jelly Apply 1 application topically 3 (three) times daily as needed. Max use for 7 days 02/23/21   Derwood Kaplan, MD  Lidocaine, Anorectal, 5 % CREA Apply 1 application topically daily as needed (pain).    [provider]  losartan (COZAAR) 25 MG tablet Take 25 mg by mouth daily. 09/27/20   [provider]  omeprazole (PRILOSEC) 20 MG capsule Take 20 mg by mouth daily. 02/08/21   [provider]  Pramox-PE-Glycerin-Petrolatum (PREPARATION H) 1-0.25-14.4-15 % CREA Apply 1 application topically 3 (three) times daily.    [provider]  predniSONE (DELTASONE) 10 MG tablet Take 2 tablets (20 mg total) by mouth 2 (two) times daily. 06/13/22   Geoffery Lyons, MD  tamsulosin (FLOMAX) 0.4 MG CAPS capsule Take 0.4 mg  by mouth daily. 07/14/20   [provider]  traMADol (ULTRAM) 50 MG tablet Take 50 mg by mouth every 6 (six) hours as needed for severe pain (pain). 10/17/20   [provider]  sucralfate (CARAFATE) 1 GM/10ML suspension Take 10 mLs (1 g total) by mouth 4 (four) times daily -  with meals and at bedtime. Patient not taking: Reported on 08/22/2018 04/19/18 05/28/20  Jacinto Halim, PA-C      Allergies    Patient has no known allergies.    Review of Systems   Review of Systems  Neurological:  Positive for headaches.  All other systems reviewed and are negative.   Physical Exam Updated Vital Signs BP 117/64 (BP Location: Left Arm)   Pulse (!) 59   Temp 98.3 F (36.8 C) (Oral)   Resp 16   Ht 5' 6.5" (1.689 m)   Wt 71.2 kg   SpO2 98%   BMI 24.96 kg/m  Physical Exam Vitals and nursing note reviewed.  Constitutional:      Comments: Well appearing for age   HENT:     Head: Normocephalic.     Mouth/Throat:     Mouth: Mucous membranes are moist.  Eyes:     Extraocular Movements: Extraocular movements intact.     Pupils: Pupils are equal, round, and reactive to light.  Cardiovascular:     Rate and Rhythm: Normal rate and regular rhythm.  Pulmonary:     Effort: Pulmonary effort is normal.     Breath sounds: Normal breath sounds.  Abdominal:     General: Bowel sounds are normal.     Palpations: Abdomen is soft.  Musculoskeletal:     Cervical back: Normal range of motion and neck supple.  Skin:    General: Skin is warm.  Neurological:     Mental Status: He is alert and oriented to person, place, and time.  Psychiatric:        Mood and Affect: Mood normal.        Behavior: Behavior normal.     ED Results / Procedures / Treatments   Labs (all labs ordered are listed, but only abnormal results are displayed) Labs Reviewed  SARS CORONAVIRUS 2 BY RT PCR  CBC WITH DIFFERENTIAL/PLATELET  BASIC METABOLIC PANEL    EKG None  Radiology No results  found.  Procedures Procedures    Medications Ordered in ED Medications - No data to display  ED Course/ Medical Decision Making/ A&P                           Medical Decision Making Scott Tate is a 66 y.o. male here presenting with headache and chills.  Daughter recently diagnosed with COVID.  Patient has nonfocal neuro exam findings.  Patient also is not hypoxic.  Consider COVID versus pneumonia versus viral syndrome.  Plan to get basic labs and chest x-ray and COVID test.  10:14 PM I reviewed patient's labs and chest x-ray.  Labs unremarkable and COVID test negative.  Chest x-ray showed no pneumonia.  At this point he is stable for discharge.  I recommend retesting in several days if he has persistent symptoms.  Problems Addressed: Viral syndrome: acute illness or injury  Amount and/or Complexity of Data Reviewed Labs: ordered. Decision-making details documented in ED Course. Radiology: ordered and independent interpretation performed. Decision-making details documented in ED Course.    Final Clinical Impression(s) / ED Diagnoses Final diagnoses:  None    Rx / DC Orders ED Discharge Orders     None         Drenda Freeze, MD 08/02/22 2214

## 2022-08-13 DIAGNOSIS — D508 Other iron deficiency anemias: Secondary | ICD-10-CM | POA: Diagnosis not present

## 2022-08-13 DIAGNOSIS — K921 Melena: Secondary | ICD-10-CM | POA: Diagnosis not present

## 2022-12-02 DIAGNOSIS — N400 Enlarged prostate without lower urinary tract symptoms: Secondary | ICD-10-CM | POA: Diagnosis not present

## 2022-12-02 DIAGNOSIS — N1831 Chronic kidney disease, stage 3a: Secondary | ICD-10-CM | POA: Diagnosis not present

## 2022-12-02 DIAGNOSIS — E7800 Pure hypercholesterolemia, unspecified: Secondary | ICD-10-CM | POA: Diagnosis not present

## 2022-12-02 DIAGNOSIS — M545 Low back pain, unspecified: Secondary | ICD-10-CM | POA: Diagnosis not present

## 2022-12-02 DIAGNOSIS — I10 Essential (primary) hypertension: Secondary | ICD-10-CM | POA: Diagnosis not present

## 2022-12-02 DIAGNOSIS — Z905 Acquired absence of kidney: Secondary | ICD-10-CM | POA: Diagnosis not present

## 2022-12-02 DIAGNOSIS — G8929 Other chronic pain: Secondary | ICD-10-CM | POA: Diagnosis not present

## 2022-12-02 DIAGNOSIS — Z122 Encounter for screening for malignant neoplasm of respiratory organs: Secondary | ICD-10-CM | POA: Diagnosis not present

## 2023-01-17 ENCOUNTER — Emergency Department (HOSPITAL_BASED_OUTPATIENT_CLINIC_OR_DEPARTMENT_OTHER)
Admission: EM | Admit: 2023-01-17 | Discharge: 2023-01-17 | Disposition: A | Discharge status: Home / Self Care | Payer: No Typology Code available for payment source | Attending: Emergency Medicine | Admitting: Emergency Medicine

## 2023-01-17 ENCOUNTER — Other Ambulatory Visit: Payer: Self-pay

## 2023-01-17 ENCOUNTER — Emergency Department (HOSPITAL_BASED_OUTPATIENT_CLINIC_OR_DEPARTMENT_OTHER): Payer: No Typology Code available for payment source

## 2023-01-17 DIAGNOSIS — S0003XA Contusion of scalp, initial encounter: Secondary | ICD-10-CM | POA: Diagnosis not present

## 2023-01-17 DIAGNOSIS — R42 Dizziness and giddiness: Secondary | ICD-10-CM | POA: Diagnosis not present

## 2023-01-17 DIAGNOSIS — I10 Essential (primary) hypertension: Secondary | ICD-10-CM | POA: Insufficient documentation

## 2023-01-17 DIAGNOSIS — W208XXA Other cause of strike by thrown, projected or falling object, initial encounter: Secondary | ICD-10-CM | POA: Diagnosis not present

## 2023-01-17 DIAGNOSIS — Z79899 Other long term (current) drug therapy: Secondary | ICD-10-CM | POA: Diagnosis not present

## 2023-01-17 DIAGNOSIS — S0990XA Unspecified injury of head, initial encounter: Secondary | ICD-10-CM

## 2023-01-17 NOTE — ED Provider Notes (Signed)
[place] [facility] Provider Note   CSN: [id number] Arrival date & time: 01/17/23  [id number]     History Chief Complaint  Patient presents with   Head Injury    Scott Tate is a 67 y.o. male with history of hypertension and one kidney presents the emergency room today for evaluation of head injury around 1100-1200 today.  No LOC.  Patient reports he was taking something off of the top shelf when a "electric cooker" fell and hit his head.  No LOC.  Denies any visual changes.  He reports he felt momentarily lightheaded to the minute but denies any syncope now feels okay. Denies any trouble walking of talking. He denies any visual changes.  Mild headache now.  No nausea or vomiting.  No chest pain or shortness of breath.  Denies any pain on movement of his eyes.  No medication taken prior to arrival.   Head Injury Associated symptoms: headache        Home Medications Prior to Admission medications   Medication Sig Start Date End Date Taking? Authorizing Provider  albuterol (VENTOLIN HFA) 108 (90 Base) MCG/ACT inhaler Inhale 2 puffs into the lungs every 6 (six) hours as needed for wheezing or shortness of breath.    [provider]  cyclobenzaprine (FLEXERIL) 10 MG tablet Take 1 tablet (10 mg total) by mouth 3 (three) times daily as needed for muscle spasms. 06/13/22   Veryl Speak, MD  docusate sodium (COLACE) 250 MG capsule Take 1 capsule (250 mg total) by mouth daily. 02/23/21   Varney Biles, MD  lidocaine (XYLOCAINE) 2 % jelly Apply 1 application topically 3 (three) times daily as needed. Max use for 7 days 02/23/21   Varney Biles, MD  Lidocaine, Anorectal, 5 % CREA Apply 1 application topically daily as needed (pain).    [provider]  losartan (COZAAR) 25 MG tablet Take 25 mg by mouth daily. 09/27/20   [provider]  omeprazole (PRILOSEC) 20 MG capsule Take 20 mg by mouth daily. 02/08/21   [provider]   Pramox-PE-Glycerin-Petrolatum (PREPARATION H) 1-0.25-14.4-15 % CREA Apply 1 application topically 3 (three) times daily.    [provider]  predniSONE (DELTASONE) 10 MG tablet Take 2 tablets (20 mg total) by mouth 2 (two) times daily. 06/13/22   Veryl Speak, MD  tamsulosin (FLOMAX) 0.4 MG CAPS capsule Take 0.4 mg by mouth daily. 07/14/20   [provider]  traMADol (ULTRAM) 50 MG tablet Take 50 mg by mouth every 6 (six) hours as needed for severe pain (pain). 10/17/20   [provider]  sucralfate (CARAFATE) 1 GM/10ML suspension Take 10 mLs (1 g total) by mouth 4 (four) times daily -  with meals and at bedtime. Patient not taking: Reported on 08/22/2018 04/19/18 05/28/20  Jillyn Ledger, PA-C      Allergies    Patient has no known allergies.    Review of Systems   Review of Systems  Constitutional:  Negative for chills and fever.  Eyes:  Negative for photophobia, pain and visual disturbance.  Neurological:  Positive for light-headedness and headaches. Negative for dizziness, syncope, speech difficulty and weakness.    Physical Exam Updated Vital Signs BP 139/76 (BP Location: Left Arm)   Pulse 61   Temp 98.2 F (36.8 C) (Oral)   Resp 16   Ht 5' 5.5" (1.664 m)   Wt 72.6 kg   SpO2 97%   BMI 26.22 kg/m  Physical  Exam Constitutional:      General: He is not in acute distress.    Appearance: He is not toxic-appearing.  HENT:     Head: Normocephalic.      Comments: No battle signs or raccoon eyes.  Previous hematoma present at the right upper forehead marked above.  No step-offs.  No crepitus.  Tender to palpation.    Right Ear: Tympanic membrane, ear canal and external ear normal.     Left Ear: Tympanic membrane, ear canal and external ear normal.     Ears:     Comments: No hemotympanums    Mouth/Throat:     Mouth: Mucous membranes are moist.  Eyes:     General: No scleral icterus.    Extraocular Movements: Extraocular movements intact.      Pupils: Pupils are equal, round, and reactive to light.  Neck:     Comments: Full range of motion without pain.  No midline or paraspinal tenderness to palpation.  No step-offs or deformities.  No signs of trauma. Pulmonary:     Effort: Pulmonary effort is normal. No respiratory distress.  Musculoskeletal:     Cervical back: Normal range of motion. No tenderness.  Skin:    General: Skin is warm and dry.  Neurological:     General: No focal deficit present.     Mental Status: He is alert. Mental status is at baseline.     GCS: GCS eye subscore is 4. GCS verbal subscore is 5. GCS motor subscore is 6.     Cranial Nerves: No cranial nerve deficit, dysarthria or facial asymmetry.     Sensory: No sensory deficit.     Motor: No weakness.     Comments: Patient alert.  GCS 15.  Cranial nerves II through XII intact.  He is answering questions appropriately with appropriate speech.  No facial droop noted.  Sensation intact throughout.  Strength is 5 out of 5 in patient's upper and lower bilateral extremities.     ED Results / Procedures / Treatments   Labs (all labs ordered are listed, but only abnormal results are displayed) Labs Reviewed - No data to display  EKG None  Radiology CT Head Wo Contrast  Result Date: 01/17/2023 CLINICAL DATA:  Head trauma, dizziness EXAM: CT HEAD WITHOUT CONTRAST TECHNIQUE: Contiguous axial images were obtained from the base of the skull through the vertex without intravenous contrast. RADIATION DOSE REDUCTION: This exam was performed according to the departmental dose-optimization program which includes automated exposure control, adjustment of the mA and/or kV according to patient size and/or use of iterative reconstruction technique. COMPARISON:  08/04/2020 FINDINGS: Brain: No evidence of acute infarction, hemorrhage, hydrocephalus, extra-axial collection or mass lesion/mass effect. Calcifications in the bilateral basal ganglia and periventricular white matter of  the right frontal region are unchanged. Vascular: No hyperdense vessel or unexpected calcification. Skull: Normal. Negative for fracture or focal lesion. Sinuses/Orbits: No acute finding. Other: Mild soft tissue swelling overlying the anterior right frontal region. IMPRESSION: 1. No acute intracranial abnormality. 2. Mild soft tissue swelling overlying the anterior right frontal region. No underlying calvarial fracture. Electronically Signed   By: Davina Poke D.O.   On: 01/17/2023 21:21    Procedures Procedures   Medications Ordered in ED Medications - No data to display  ED Course/ Medical Decision Making/ A&P  }                          Medical Decision  Making Amount and/or Complexity of Data Reviewed Radiology: ordered.    67 y.o. male presents to the ER today for evaluation of head injury. Differential diagnosis includes but is not limited to concussion, intercranial bleed, calvarial fracture, hematoma. Vital signs show unremarkable. Physical exam as noted above.   He is a small hematoma noted to the anterior right of his scalp as noted in the pictures above.  No step-offs or deformities noted to the area.  Will order CT imaging.  Do not think any CT imaging of the neck is needed.  He denies any neck pain and has full range of motion without pain.  There is no pain on palpation.  No hyperextension or flexion from the injury.  CT of the head shows  1. No acute intracranial abnormality. 2. Mild soft tissue swelling overlying the anterior right frontal region. No underlying calvarial fracture.  I do not think any additional imaging is needed at this time.  His neurological exam is benign.  He is answering questions appropriately appropriate speech.  Reports minor pain.  Likely soft tissue swelling from the head injury. Given normal CT imaging, will give head injury return precautions as well as discharge instructions. We discussed plan at bedside on head injury, brain rest, and follow-up  with primary care doctor. We discussed strict return precautions and red flag symptoms. The patient verbalized their understanding and agrees to the plan. The patient is stable and being discharged home in good condition.  Portions of this report may have been transcribed using voice recognition software. Every effort was made to ensure accuracy; however, inadvertent computerized transcription errors may be present.   Final Clinical Impression(s) / ED Diagnoses Final diagnoses:  Injury of head, initial encounter    Rx / DC Orders ED Discharge Orders     None         Sherrell Puller, Hershal Coria 01/17/23 2223    Davonna Belling, MD 01/17/23 [phone]

## 2023-01-17 NOTE — ED Triage Notes (Signed)
Pt via POV after being in the head earlier today by a piece of equipment at work. Pt has a small reddened area to right forehead with a palpable knot; skin is intact. Pt denies LOC but says he did feel dizzy for a few minutes. A/O x 4 and ambulatory in triage.

## 2023-01-17 NOTE — Discharge Instructions (Signed)
You were seen in the ER today for evaluation of your head after injury.  Your CT did show some swelling to the area however this: Her youth is well-appearing.  I recommend using ice for 15 minutes every few hours for pain.  You also take Tylenol 1000 mg every 6 hours as needed.  I recommend brain rest, sitting in a dark room without any TVs or screens and no noise.  Additionally recommend follow with your primary care doctor within the week.  If you have any concerns, new or worsening symptoms, please return to the nearest emergency department for reevaluation.  Get help right away if: You have: A very bad headache that is not helped by medicine. Trouble walking or weakness in your arms and legs. Clear or bloody fluid coming from your nose or ears. Changes in how you see (vision). A seizure. More confusion or more grumpy moods. Your symptoms get worse. You are sleepier than normal and have trouble staying awake. You lose your balance. The black centers of your eyes (pupils) change in size. Your speech is slurred. Your dizziness gets worse. You vomit. These symptoms may be an emergency. Do not wait to see if the symptoms will go away. Get medical help right away. Call your local emergency services (911 in the [place] Do not drive yourself to the hospital.

## 2023-02-11 DIAGNOSIS — D649 Anemia, unspecified: Secondary | ICD-10-CM | POA: Diagnosis not present

## 2023-02-19 ENCOUNTER — Telehealth: Payer: Self-pay

## 2023-02-19 NOTE — Telephone Encounter (Signed)
Patient interested in LDCT.  States he was referred by Dr. Jacqulyn Bath.  Review of smoking history reveals patient is very light smoking, having smoked 1 to 2 cigarettes a day for (occ 3 cigarettes but rare) his years of smoking cigarettes.  He currently averages 2 cigarettes per day.  This would put him much less than the 20 pack year requirement for a LDCT.  He only met about an 11 pack year history.  Advised patient he does not meet the criteria for a low dose CT and we would notify Dr. Jacqulyn Bath, as she may recommend an alternative type of CT and her office would contact him if other options are offered.  Patient acknowledged understanding.  Dr. Jacqulyn Bath copied on this message as FYI.

## 2023-04-24 DIAGNOSIS — H5203 Hypermetropia, bilateral: Secondary | ICD-10-CM | POA: Diagnosis not present

## 2023-06-04 DIAGNOSIS — E7800 Pure hypercholesterolemia, unspecified: Secondary | ICD-10-CM | POA: Diagnosis not present

## 2023-06-04 DIAGNOSIS — M25511 Pain in right shoulder: Secondary | ICD-10-CM | POA: Diagnosis not present

## 2023-06-04 DIAGNOSIS — N400 Enlarged prostate without lower urinary tract symptoms: Secondary | ICD-10-CM | POA: Diagnosis not present

## 2023-06-04 DIAGNOSIS — N1831 Chronic kidney disease, stage 3a: Secondary | ICD-10-CM | POA: Diagnosis not present

## 2023-06-04 DIAGNOSIS — K219 Gastro-esophageal reflux disease without esophagitis: Secondary | ICD-10-CM | POA: Diagnosis not present

## 2023-06-04 DIAGNOSIS — G8929 Other chronic pain: Secondary | ICD-10-CM | POA: Diagnosis not present

## 2023-06-04 DIAGNOSIS — Z0000 Encounter for general adult medical examination without abnormal findings: Secondary | ICD-10-CM | POA: Diagnosis not present

## 2023-06-04 DIAGNOSIS — Z905 Acquired absence of kidney: Secondary | ICD-10-CM | POA: Diagnosis not present

## 2023-06-04 DIAGNOSIS — M545 Low back pain, unspecified: Secondary | ICD-10-CM | POA: Diagnosis not present

## 2023-06-04 DIAGNOSIS — M4602 Spinal enthesopathy, cervical region: Secondary | ICD-10-CM | POA: Diagnosis not present

## 2023-06-04 DIAGNOSIS — K644 Residual hemorrhoidal skin tags: Secondary | ICD-10-CM | POA: Diagnosis not present

## 2023-06-04 DIAGNOSIS — I10 Essential (primary) hypertension: Secondary | ICD-10-CM | POA: Diagnosis not present

## 2023-06-05 DIAGNOSIS — I10 Essential (primary) hypertension: Secondary | ICD-10-CM | POA: Diagnosis not present

## 2023-06-05 DIAGNOSIS — Z125 Encounter for screening for malignant neoplasm of prostate: Secondary | ICD-10-CM | POA: Diagnosis not present

## 2023-06-05 DIAGNOSIS — E7800 Pure hypercholesterolemia, unspecified: Secondary | ICD-10-CM | POA: Diagnosis not present

## 2023-07-16 ENCOUNTER — Encounter (HOSPITAL_BASED_OUTPATIENT_CLINIC_OR_DEPARTMENT_OTHER): Payer: Self-pay | Admitting: Emergency Medicine

## 2023-07-16 ENCOUNTER — Emergency Department (HOSPITAL_BASED_OUTPATIENT_CLINIC_OR_DEPARTMENT_OTHER)
Admission: EM | Admit: 2023-07-16 | Discharge: 2023-07-16 | Disposition: A | Discharge status: Home / Self Care | Payer: No Typology Code available for payment source | Attending: Emergency Medicine | Admitting: Emergency Medicine

## 2023-07-16 ENCOUNTER — Other Ambulatory Visit: Payer: Self-pay

## 2023-07-16 ENCOUNTER — Emergency Department (HOSPITAL_BASED_OUTPATIENT_CLINIC_OR_DEPARTMENT_OTHER): Payer: No Typology Code available for payment source

## 2023-07-16 DIAGNOSIS — Z79899 Other long term (current) drug therapy: Secondary | ICD-10-CM | POA: Diagnosis not present

## 2023-07-16 DIAGNOSIS — R059 Cough, unspecified: Secondary | ICD-10-CM | POA: Diagnosis not present

## 2023-07-16 DIAGNOSIS — R001 Bradycardia, unspecified: Secondary | ICD-10-CM | POA: Insufficient documentation

## 2023-07-16 DIAGNOSIS — R0789 Other chest pain: Secondary | ICD-10-CM | POA: Diagnosis not present

## 2023-07-16 DIAGNOSIS — I10 Essential (primary) hypertension: Secondary | ICD-10-CM | POA: Diagnosis not present

## 2023-07-16 DIAGNOSIS — R0602 Shortness of breath: Secondary | ICD-10-CM | POA: Diagnosis not present

## 2023-07-16 DIAGNOSIS — F17200 Nicotine dependence, unspecified, uncomplicated: Secondary | ICD-10-CM | POA: Diagnosis not present

## 2023-07-16 DIAGNOSIS — Z1152 Encounter for screening for COVID-19: Secondary | ICD-10-CM | POA: Insufficient documentation

## 2023-07-16 DIAGNOSIS — R079 Chest pain, unspecified: Secondary | ICD-10-CM | POA: Diagnosis not present

## 2023-07-16 LAB — BASIC METABOLIC PANEL WITH GFR
Anion gap: 10 (ref 5–15)
BUN: 12 mg/dL (ref 8–23)
CO2: 22 mmol/L (ref 22–32)
Calcium: 8.9 mg/dL (ref 8.9–10.3)
Chloride: 106 mmol/L (ref 98–111)
Creatinine, Ser: 1.15 mg/dL (ref 0.61–1.24)
GFR, Estimated: >60 mL/min (ref >60)
Glucose, Bld: 80 mg/dL (ref 70–99)
Potassium: 3.6 mmol/L (ref 3.5–5.1)
Sodium: 138 mmol/L (ref 135–145)

## 2023-07-16 LAB — TROPONIN I (HIGH SENSITIVITY)
Troponin I (High Sensitivity): 6 ng/L (ref <18)
Troponin I (High Sensitivity): 7 ng/L (ref <18)

## 2023-07-16 LAB — RESP PANEL BY RT-PCR (RSV, FLU A&B, COVID)  RVPGX2
Influenza A by PCR: NEGATIVE
Influenza B by PCR: NEGATIVE
Resp Syncytial Virus by PCR: NEGATIVE
SARS Coronavirus 2 by RT PCR: NEGATIVE

## 2023-07-16 LAB — CBC
HCT: 40 % (ref 39.0–52.0)
Hemoglobin: 13.5 g/dL (ref 13.0–17.0)
MCH: 29.5 pg (ref 26.0–34.0)
MCHC: 33.8 g/dL (ref 30.0–36.0)
MCV: 87.3 fL (ref 80.0–100.0)
Platelets: 248 10*3/uL (ref 150–400)
RBC: 4.58 MIL/uL (ref 4.22–5.81)
RDW: 12.8 % (ref 11.5–15.5)
WBC: 9.5 10*3/uL (ref 4.0–10.5)
nRBC: 0 % (ref 0.0–0.2)

## 2023-07-16 MED ORDER — GUAIFENESIN 100 MG/5ML PO LIQD: 5.0000 mL | Freq: Four times a day (QID) | ORAL | 0 refills | Status: AC | PRN

## 2023-07-16 MED ORDER — BENZONATATE 100 MG PO CAPS: 100.0000 mg | ORAL_CAPSULE | Freq: Three times a day (TID) | ORAL | 0 refills | Status: AC

## 2023-07-16 NOTE — ED Triage Notes (Signed)
Pt ambulatory to triage with c/o chest pain.  Pt denies n/v, states mild SHOB and cough.  States pain increases with cough.  Pt states pain and cough both started yesterday morning.NAD noted at present.

## 2023-07-16 NOTE — Discharge Instructions (Addendum)
You came to the emergency department with chest pain. The chest pain is not from a heart attack, it is most likely from muscle pain due to the coughing. You may take cough medication such as robitussin, tessalon pearls, and NSAIDs like ibuprofen to help with the chest wall pain. Please follow up with PCP in 1-2 weeks. You will be called with results of COVID testing.

## 2023-07-16 NOTE — ED Provider Notes (Signed)
[place] [facility]MENT AT [facility] Provider Note   CSN: [id number] Arrival date & time: 07/16/23  1708     History  Chief Complaint  Patient presents with   Chest Pain    Scott Tate is a 67 y.o. male with a past medical history of HTN and a current smoker who presents to the emergency department for evaluation of chest pain that started yesterday when he was coughing.  He describes the chest pain as 8/10 constant that is worsened by coughing. The pain is in the same exact spot. He reports SOB due to his productive cough and URI symptoms. He denies recent travel, surgery, or leg swelling. He denies sick contacts, but would like to be tested for COVID.     Home Medications Prior to Admission medications   Medication Sig Start Date End Date Taking? Authorizing Provider  benzonatate (TESSALON) 100 MG capsule Take 1 capsule (100 mg total) by mouth every 8 (eight) hours. 07/16/23  Yes Nils Thor, Irving Burton, DO  guaiFENesin (ROBITUSSIN) 100 MG/5ML liquid Take 5 mLs by mouth every 6 (six) hours as needed for cough or to loosen phlegm. 07/16/23  Yes Bernedette Auston, Irving Burton, DO  albuterol (VENTOLIN HFA) 108 (90 Base) MCG/ACT inhaler Inhale 2 puffs into the lungs every 6 (six) hours as needed for wheezing or shortness of breath.    [provider]  cyclobenzaprine (FLEXERIL) 10 MG tablet Take 1 tablet (10 mg total) by mouth 3 (three) times daily as needed for muscle spasms. 06/13/22   Geoffery Lyons, MD  docusate sodium (COLACE) 250 MG capsule Take 1 capsule (250 mg total) by mouth daily. 02/23/21   Derwood Kaplan, MD  lidocaine (XYLOCAINE) 2 % jelly Apply 1 application topically 3 (three) times daily as needed. Max use for 7 days 02/23/21   Derwood Kaplan, MD  Lidocaine, Anorectal, 5 % CREA Apply 1 application topically daily as needed (pain).    [provider]  losartan (COZAAR) 25 MG tablet Take 25 mg by mouth daily. 09/27/20   [provider]  omeprazole  (PRILOSEC) 20 MG capsule Take 20 mg by mouth daily. 02/08/21   [provider]  Pramox-PE-Glycerin-Petrolatum (PREPARATION H) 1-0.25-14.4-15 % CREA Apply 1 application topically 3 (three) times daily.    [provider]  predniSONE (DELTASONE) 10 MG tablet Take 2 tablets (20 mg total) by mouth 2 (two) times daily. 06/13/22   Geoffery Lyons, MD  tamsulosin (FLOMAX) 0.4 MG CAPS capsule Take 0.4 mg by mouth daily. 07/14/20   [provider]  traMADol (ULTRAM) 50 MG tablet Take 50 mg by mouth every 6 (six) hours as needed for severe pain (pain). 10/17/20   [provider]  sucralfate (CARAFATE) 1 GM/10ML suspension Take 10 mLs (1 g total) by mouth 4 (four) times daily -  with meals and at bedtime. Patient not taking: Reported on 08/22/2018 04/19/18 05/28/20  Jacinto Halim, PA-C      Allergies    Patient has no known allergies.    Review of Systems   Review of Systems  Constitutional:  Negative for chills and fever.  HENT:  Positive for congestion and sore throat.   Respiratory:  Positive for cough and shortness of breath.   Cardiovascular:  Positive for chest pain. Negative for leg swelling.    Physical Exam Updated Vital Signs BP 125/65   Pulse (!) 44   Temp 98.1 F (36.7 C) (Oral)   Resp (!) 21   Ht 5\' 7"  (1.702  m)   Wt 66.7 kg   SpO2 98%   BMI 23.02 kg/m  Physical Exam Vitals reviewed.  Constitutional:      General: He is not in acute distress.    Appearance: He is well-developed. He is not ill-appearing, toxic-appearing or diaphoretic.  HENT:     Head: Normocephalic.  Cardiovascular:     Rate and Rhythm: Regular rhythm. Bradycardia present.     Pulses:          Radial pulses are 2+ on the right side and 2+ on the left side.     Heart sounds: Normal heart sounds.  Pulmonary:     Effort: Pulmonary effort is normal. No tachypnea, accessory muscle usage or respiratory distress.     Breath sounds: Normal breath sounds.  Chest:     Chest  wall: Tenderness (Bilateral anterior chest wall tenderness to palpate) present. No crepitus.  Musculoskeletal:     Right lower leg: No tenderness. No edema.     Left lower leg: No tenderness. No edema.  Skin:    General: Skin is warm and dry.     Coloration: Skin is not pale.  Neurological:     Mental Status: He is alert and oriented to person, place, and time.  Psychiatric:        Mood and Affect: Mood normal.     ED Results / Procedures / Treatments   Labs (all labs ordered are listed, but only abnormal results are displayed) Labs Reviewed  RESP PANEL BY RT-PCR (RSV, FLU A&B, COVID)  [id number]  BASIC METABOLIC PANEL  CBC  TROPONIN I (HIGH SENSITIVITY)  TROPONIN I (HIGH SENSITIVITY)    EKG EKG Interpretation Date/Time:  Wednesday July 16 2023 17:19:46 EDT Ventricular Rate:  66 PR Interval:  137 QRS Duration:  97 QT Interval:  377 QTC Calculation: 395 R Axis:   85  Text Interpretation: Sinus rhythm Borderline right axis deviation Probable left ventricular hypertrophy ST elevation, consider anterior injury No significant change since prior 4/22 Confirmed by Meridee Score [phone]) on 07/16/2023 5:30:05 PM  Radiology DG Chest 2 View  Result Date: 07/16/2023 CLINICAL DATA:  Chest pain EXAM: CHEST - 2 VIEW COMPARISON:  08/02/2022 FINDINGS: The heart size and mediastinal contours are within normal limits. Both lungs are clear. The visualized skeletal structures are unremarkable. IMPRESSION: No active cardiopulmonary disease. Electronically Signed   By: Duanne Guess D.O.   On: 07/16/2023 18:13    Procedures Procedures    Medications Ordered in ED Medications - No data to display  ED Course/ Medical Decision Making/ A&P Clinical Course as of 07/16/23 2304  Wed Jul 16, 2023  2141 He is complaining of some pain in his chest especially with coughing with been going on for the last few days.  Productive of some white sputum.  No fever.  No history of cardiac disease.   EKG and chest x-ray unremarkable getting troponins and COVID swab.  Likely if no acute findings can discharged with symptomatic treatment. [MB]    Clinical Course User Index [MB] Terrilee Files, MD                                 Medical Decision Making Patient is a 67 year old male with a past medical history of hypertension and current smoker who presents for evaluation of chest pain.  Vital signs are stable, physical exam reveals reproducible anterior chest wall pain on palpation.  Musculoskeletal pain at this time is the most likely cause of the chest pain. ACS is considered, but initial troponin is 6 and the repeat troponin is 7 with a stable EKG that has similar findings from 2022.  Heart score is also low calculated at 3.  PE is also considered however DVT Wells is a 0.  Aortic dissection also considered, troponins are of within normal limits and bilateral radial pulses are present and symmetrical, and there is no radiation of pain to the back.  Patient is most likely having a musculoskeletal pain such as costochondritis from an increase in cough from an upper respiratory infection and would benefit from anti-inflammatory medication and antitussives. COVID, influenza A and B, RSV is pending.   Patient discharged home in stable condition with normal vital signs.   Amount and/or Complexity of Data Reviewed Labs: ordered.    Details: Troponin:6, repeat: 7 CBC: WNL BMP: WNL Covid, RSV, influenza A, influenza B: pending  Radiology: ordered and independent interpretation performed.    Details: Agree with radiologist findings ECG/medicine tests: ordered and independent interpretation performed.    Details: Normal sinus rhythm, stable in appearance compared to April 2022.           Final Clinical Impression(s) / ED Diagnoses Final diagnoses:  Chest wall pain    Rx / DC Orders ED Discharge Orders          Ordered    benzonatate (TESSALON) 100 MG capsule  Every 8 hours         07/16/23 2303    guaiFENesin (ROBITUSSIN) 100 MG/5ML liquid  Every 6 hours PRN        07/16/23 2303              Faith Rogue, DO 07/16/23 2305    Terrilee Files, MD 07/17/23 1102

## 2023-08-12 DIAGNOSIS — K649 Unspecified hemorrhoids: Secondary | ICD-10-CM | POA: Diagnosis not present

## 2023-08-22 ENCOUNTER — Emergency Department (HOSPITAL_BASED_OUTPATIENT_CLINIC_OR_DEPARTMENT_OTHER)
Admission: EM | Admit: 2023-08-22 | Discharge: 2023-08-22 | Disposition: A | Discharge status: Home / Self Care | Payer: No Typology Code available for payment source | Attending: Emergency Medicine | Admitting: Emergency Medicine

## 2023-08-22 ENCOUNTER — Other Ambulatory Visit: Payer: Self-pay

## 2023-08-22 ENCOUNTER — Encounter (HOSPITAL_BASED_OUTPATIENT_CLINIC_OR_DEPARTMENT_OTHER): Payer: Self-pay

## 2023-08-22 DIAGNOSIS — F17210 Nicotine dependence, cigarettes, uncomplicated: Secondary | ICD-10-CM | POA: Insufficient documentation

## 2023-08-22 DIAGNOSIS — X58XXXA Exposure to other specified factors, initial encounter: Secondary | ICD-10-CM | POA: Diagnosis not present

## 2023-08-22 DIAGNOSIS — I10 Essential (primary) hypertension: Secondary | ICD-10-CM | POA: Diagnosis not present

## 2023-08-22 DIAGNOSIS — Z79899 Other long term (current) drug therapy: Secondary | ICD-10-CM | POA: Diagnosis not present

## 2023-08-22 DIAGNOSIS — S43401A Unspecified sprain of right shoulder joint, initial encounter: Secondary | ICD-10-CM | POA: Insufficient documentation

## 2023-08-22 DIAGNOSIS — T5991XA Toxic effect of unspecified gases, fumes and vapors, accidental (unintentional), initial encounter: Secondary | ICD-10-CM | POA: Diagnosis not present

## 2023-08-22 MED ORDER — IPRATROPIUM-ALBUTEROL 0.5-2.5 (3) MG/3ML IN SOLN
3.0000 mL | Freq: Once | RESPIRATORY_TRACT | Status: AC
  Administered 2023-08-22: 3 mL via RESPIRATORY_TRACT
  Filled 2023-08-22: qty 3

## 2023-08-22 NOTE — Discharge Instructions (Addendum)
We evaluated you after your toxic gas exposure.  Your evaluation in the emergency department is reassuring.  Your lungs sounded normal and your oxygen level was also normal.  When you combine certain chemicals, it can produce toxic gas and cause lung irritation.  I do not think that you had a large enough exposure to cause any permanent or serious lung injury.  We also evaluated you for your shoulder pain.  Your symptoms seem most consistent with a rotator cuff sprain.  Please take at 1000 mg of Tylenol every 6 hours as needed for pain.  You can continue to use Aspercreme as needed.  Please follow-up with Scott Tate with orthopedics as he may benefit from a physical therapy referral.  Please keep an eye on your symptoms.  If you do notice any new or worsening symptoms such as increasing shortness of breath, persistent or uncontrolled cough, fevers or chills, lightheadedness or dizziness, chest pain, or any other new symptoms, please return for reassessment.

## 2023-08-22 NOTE — ED Triage Notes (Signed)
The patient stated that Clorox and lime away accidentally got mixed up. Now having cough and shortness of breath.

## 2023-08-22 NOTE — ED Provider Notes (Signed)
[place] [facility] AT [facility] Provider Note  CSN: [id number] Arrival date & time: 08/22/23 1347  Chief Complaint(s) Toxic Inhalation  HPI Scott Tate is a 67 y.o. male with history of hypertension presenting to the emergency department with gas exposure.  He reports that he was working with cleaning products, accidentally knocked over some Clorox bleach and some type of Lyme solution, seem to give off some noxious fumes.  He removed himself from the situation fairly quickly.  He reports afterwards he had some sore throat, cough, mild shortness of breath.  Reports currently his symptoms have significantly improved.  This occurred around 3 hours ago.  No chest pain.  No syncope.  No fevers or chills.  No other new symptoms.  He also reports that while he is here he wanted [place] to evaluate his right shoulder.  He reports this has been bothering him for about a month but he has not had time to follow-up with anybody yet.  Reports that he uses Aspercreme with some improvement.  Not taking any oral medications.  No falls or injury.  No fevers or chills.   Past Medical History Past Medical History:  Diagnosis Date   Hypertension    There are no problems to display for this patient.  Home Medication(s) Prior to Admission medications   Medication Sig Start Date End Date Taking? Authorizing Provider  albuterol (VENTOLIN HFA) 108 (90 Base) MCG/ACT inhaler Inhale 2 puffs into the lungs every 6 (six) hours as needed for wheezing or shortness of breath.    [provider]  benzonatate (TESSALON) 100 MG capsule Take 1 capsule (100 mg total) by mouth every 8 (eight) hours. 07/16/23   Faith Rogue, DO  cyclobenzaprine (FLEXERIL) 10 MG tablet Take 1 tablet (10 mg total) by mouth 3 (three) times daily as needed for muscle spasms. 06/13/22   Geoffery Lyons, MD  docusate sodium (COLACE) 250 MG capsule Take 1 capsule (250 mg total) by mouth daily. 02/23/21   Derwood Kaplan, MD   guaiFENesin (ROBITUSSIN) 100 MG/5ML liquid Take 5 mLs by mouth every 6 (six) hours as needed for cough or to loosen phlegm. 07/16/23   Faith Rogue, DO  lidocaine (XYLOCAINE) 2 % jelly Apply 1 application topically 3 (three) times daily as needed. Max use for 7 days 02/23/21   Derwood Kaplan, MD  Lidocaine, Anorectal, 5 % CREA Apply 1 application topically daily as needed (pain).    [provider]  losartan (COZAAR) 25 MG tablet Take 25 mg by mouth daily. 09/27/20   [provider]  omeprazole (PRILOSEC) 20 MG capsule Take 20 mg by mouth daily. 02/08/21   [provider]  Pramox-PE-Glycerin-Petrolatum (PREPARATION H) 1-0.25-14.4-15 % CREA Apply 1 application topically 3 (three) times daily.    [provider]  predniSONE (DELTASONE) 10 MG tablet Take 2 tablets (20 mg total) by mouth 2 (two) times daily. 06/13/22   Geoffery Lyons, MD  tamsulosin (FLOMAX) 0.4 MG CAPS capsule Take 0.4 mg by mouth daily. 07/14/20   [provider]  traMADol (ULTRAM) 50 MG tablet Take 50 mg by mouth every 6 (six) hours as needed for severe pain (pain). 10/17/20   [provider]  sucralfate (CARAFATE) 1 GM/10ML suspension Take 10 mLs (1 g total) by mouth 4 (four) times daily -  with meals and at bedtime. 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[employer], L kidney    KIDNEY DONATION     PANCREATECTOMY     Family History Family History  Problem Relation Age of Onset   Diabetes Mother    Hypertension Mother    Hypertension Father    Diabetes Father     Social History Social History   Tobacco Use   Smoking status: Every Day    Current packs/day: 0.25    Types: Cigarettes   Smokeless  tobacco: Never  Vaping Use   Vaping status: Never Used  Substance Use Topics   Alcohol use: Yes    Alcohol/week: 8.0 standard drinks of alcohol    Types: 8 Cans of beer per week    Comment: occ   Drug use: Not Currently    Types: Marijuana, Cocaine    Comment: last use 12/2014   Allergies Patient has no known allergies.  Review of Systems Review of Systems  All other systems reviewed and are negative.   Physical Exam Vital Signs  I have reviewed the triage vital signs BP 134/69   Pulse (!) 58   Temp 97.9 F (36.6 C) (Oral)   Resp (!) 28   Ht 5\' 5"  (1.651 m)   Wt 71.2 kg   SpO2 99%   BMI 26.13 kg/m  Physical Exam Vitals and nursing note reviewed.  Constitutional:      General: He is not in acute distress.    Appearance: Normal appearance.  HENT:     Mouth/Throat:     Mouth: Mucous membranes are moist.     Pharynx: No oropharyngeal exudate or posterior oropharyngeal erythema.  Eyes:     Conjunctiva/sclera: Conjunctivae normal.  Cardiovascular:     Rate and Rhythm: Normal rate and regular rhythm.  Pulmonary:     Effort: Pulmonary effort is normal. No respiratory distress.     Breath sounds: Normal breath sounds. No wheezing, rhonchi or rales.  Abdominal:     General: Abdomen is flat.     Palpations: Abdomen is soft.     Tenderness: There is no abdominal tenderness.  Musculoskeletal:     Right lower leg: No edema.     Left lower leg: No edema.     Comments: Mild pain with empty can test in the right shoulder, external and internal rotation.  No tenderness.  Range of motion intact.  No pain with passive range of motion.  Distal radial pulse intact.  Distal neurologic sensation and strength intact.  Skin:    General: Skin is warm and dry.     Capillary Refill: Capillary refill takes less than 2 seconds.  Neurological:     Mental Status: He is alert and oriented to person, place, and time. Mental status is at baseline.  Psychiatric:        Mood and Affect: Mood  normal.        Behavior: Behavior normal.     ED Results and Treatments Labs (all labs ordered are listed, but only abnormal results are displayed) Labs Reviewed - No data to display  Radiology No results found.  Pertinent labs & imaging results that were available during my care of the patient were reviewed by me and considered in my medical decision making (see MDM for details).  Medications Ordered in ED Medications  ipratropium-albuterol (DUONEB) 0.5-2.5 (3) MG/3ML nebulizer solution 3 mL (3 mLs Nebulization Given 08/22/23 1422)                                                                                                                                     Procedures Procedures  (including critical care time)  Medical Decision Making / ED Course   MDM:  67 year old presenting to the emergency department with gas exposure.  Given the combination of bleach and the lime solution, it sounds as if patient may have caused some chlorine gas to develop and had a brief exposure.  Initially had some coughing and shortness of breath but this is significantly improved.  His lungs are clear on exam.  He has normal oxygen level.  Very low concern for any serious lung injury.  On my evaluation the patient is not tachypneic and has no increased work of breathing and is resting comfortably.  Discussed avoiding mixing these chemicals in the future.  Also discussed strict return precautions for any new or worsening symptoms such as worsening shortness of breath.  He also reports a month of right shoulder pain.  Seems consistent with rotator cuff sprain, arthritis.  Very low concern for fracture, dislocation, septic joint.  Advise follow-up with orthopedic surgery and discuss additional pain control.  Will discharge patient to home. All questions answered. Patient comfortable  with plan of discharge. Return precautions discussed with patient and specified on the after visit summary.       Additional history obtained: -Additional history obtained from spouse    Medicines ordered and prescription drug management: Meds ordered this encounter  Medications   ipratropium-albuterol (DUONEB) 0.5-2.5 (3) MG/3ML nebulizer solution 3 mL    -I have reviewed the patients home medicines and have made adjustments as needed  Social Determinants of Health:  Diagnosis or treatment significantly limited by social determinants of health: current smoker   Reevaluation: After the interventions noted above, I reevaluated the patient and found that their symptoms have improved  Co morbidities that complicate the patient evaluation  Past Medical History:  Diagnosis Date   Hypertension       Dispostion: Disposition decision including need for hospitalization was considered, and patient discharged from emergency department.    Final Clinical Impression(s) / ED Diagnoses Final diagnoses:  Toxic effect of gas exposure, accidental or unintentional, initial encounter  Sprain of right shoulder, unspecified shoulder sprain type, initial encounter     This chart was dictated using voice recognition software.  Despite best efforts to proofread,  errors can occur which can change the documentation meaning.    Lonell Grandchild, MD 08/22/23 1550

## 2023-09-02 DIAGNOSIS — M25511 Pain in right shoulder: Secondary | ICD-10-CM | POA: Diagnosis not present

## 2023-09-04 ENCOUNTER — Other Ambulatory Visit: Payer: Self-pay | Admitting: Family Medicine

## 2023-09-04 DIAGNOSIS — G8929 Other chronic pain: Secondary | ICD-10-CM

## 2023-09-22 ENCOUNTER — Ambulatory Visit
Admission: EM | Admit: 2023-09-22 | Discharge: 2023-09-22 | Disposition: A | Discharge status: Home / Self Care | Payer: No Typology Code available for payment source | Attending: Physician Assistant | Admitting: Physician Assistant

## 2023-09-22 DIAGNOSIS — R195 Other fecal abnormalities: Secondary | ICD-10-CM | POA: Diagnosis not present

## 2023-09-22 DIAGNOSIS — K2900 Acute gastritis without bleeding: Secondary | ICD-10-CM | POA: Diagnosis not present

## 2023-09-22 DIAGNOSIS — R1010 Upper abdominal pain, unspecified: Secondary | ICD-10-CM

## 2023-09-22 LAB — POC HEMOCCULT BLD/STL (OFFICE/1-CARD/DIAGNOSTIC): Fecal Occult Blood, POC: NEGATIVE

## 2023-09-22 MED ORDER — FAMOTIDINE 20 MG PO TABS: 20.0000 mg | ORAL_TABLET | Freq: Every day | ORAL | 0 refills | Status: AC

## 2023-09-22 MED ORDER — SUCRALFATE 1 G PO TABS: 1.0000 g | ORAL_TABLET | Freq: Three times a day (TID) | ORAL | 0 refills | Status: AC

## 2023-09-22 MED ORDER — ALUM & MAG HYDROXIDE-SIMETH 200-200-20 MG/5ML PO SUSP
30.0000 mL | Freq: Once | ORAL | Status: AC
Start: 2023-09-22 — End: 2023-09-22
  Administered 2023-09-22: 30 mL via ORAL

## 2023-09-22 NOTE — ED Triage Notes (Addendum)
Patient states he is having problems with his stomach, states every time he eats his stomach aches x 3 days. Treated with gas x and Pepto bismol without relief.

## 2023-09-22 NOTE — Discharge Instructions (Signed)
You did not have any blood in your stool.  I am glad that you are feeling better after the medication.  Start Pepcid at bedtime.  Use sucralfate before each meal and before bed for the next week.  Eat a bland diet and avoid spicy/acidic/fatty foods.  Avoid alcohol and NSAIDs (aspirin, ibuprofen/Advil, naproxen/Aleve).  If your symptoms are not improving please follow-up with gastroenterology; call to schedule an appointment.  If anything worsens and you have bloody stools, black stools, nausea, vomiting, chest pain, shortness of breath, severe abdominal pain you need to go to the emergency room.

## 2023-09-22 NOTE — ED Provider Notes (Signed)
[facility]    CSN: [id number] Arrival date & time: 09/22/23  1706      History   Chief Complaint Chief Complaint  Patient presents with   Abdominal Pain    HPI Scott Tate is a 67 y.o. male.   Patient presents today with a 3-day history of upper abdominal pain.  He reports pain is worse after he eats.  He has had a few dark stools but is unsure if this qualifies as melena.  He denies any hematochezia, nausea, vomiting, fever.  He has tried Pepto-Bismol and is unsure if the dark stool began after starting this medication.  He has also tried gas relief without improvement.  He does not drink alcohol regular basis and does not take NSAIDs.  Denies history of gastrointestinal disorder including GERD, peptic ulcer disease.  He has never had an endoscopy before.  Denies any chest pain, palpitations.  Does report some shortness of breath but this is at baseline.  He reports pain is rated 8 on a 0-10 pain scale, described as burning, no aggravating relieving factors identified.    Past Medical History:  Diagnosis Date   Hypertension     There are no problems to display for this patient.   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 Authorizing Provider  famotidine (PEPCID) 20 MG tablet Take 1 tablet (20 mg total) by mouth at bedtime. 09/22/23  Yes Karron Alvizo K, PA-C  sucralfate (CARAFATE) 1 g tablet Take 1 tablet (1 g total) by mouth 4 (four) times daily -  with meals and at bedtime. 09/22/23  Yes Aisley Whan K, PA-C  albuterol (VENTOLIN HFA) 108 (90 Base) MCG/ACT inhaler Inhale 2 puffs into the lungs every 6 (six) hours as needed for wheezing or shortness of breath.    [provider]  benzonatate (TESSALON) 100 MG capsule Take 1 capsule (100 mg total) by  mouth every 8 (eight) hours. 07/16/23   Faith Rogue, DO  cyclobenzaprine (FLEXERIL) 10 MG tablet Take 1 tablet (10 mg total) by mouth 3 (three) times daily as needed for muscle spasms. 06/13/22   Geoffery Lyons, MD  docusate sodium (COLACE) 250 MG capsule Take 1 capsule (250 mg total) by mouth daily. 02/23/21   Derwood Kaplan, MD  guaiFENesin (ROBITUSSIN) 100 MG/5ML liquid Take 5 mLs by mouth every 6 (six) hours as needed for cough or to loosen phlegm. 07/16/23   Faith Rogue, DO  lidocaine (XYLOCAINE) 2 % jelly Apply 1 application topically 3 (three) times daily as needed. Max use for 7 days 02/23/21   Derwood Kaplan, MD  Lidocaine, Anorectal, 5 % CREA Apply 1 application topically daily as needed (pain).    [provider]  losartan (COZAAR) 25 MG tablet Take 25 mg by mouth daily. 09/27/20   [provider]  omeprazole (PRILOSEC) 20 MG capsule Take 20 mg by mouth daily. 02/08/21   [provider]  Pramox-PE-Glycerin-Petrolatum (PREPARATION H) 1-0.25-14.4-15 % CREA Apply 1 application topically 3 (three) times daily.    [provider]  predniSONE (DELTASONE) 10 MG tablet Take 2 tablets (20 mg total) by mouth 2 (two) times daily. 06/13/22   Geoffery Lyons, MD  tamsulosin (FLOMAX) 0.4 MG CAPS  capsule Take 0.4 mg by mouth daily. 07/14/20   [provider]  traMADol (ULTRAM) 50 MG tablet Take 50 mg by mouth every 6 (six) hours as needed for severe pain (pain). 10/17/20   [provider]    Family History Family History  Problem Relation Age of Onset   Diabetes Mother    Hypertension Mother    Hypertension Father    Diabetes Father     Social History Social History   Tobacco Use   Smoking status: Every Day    Current packs/day: 0.25    Types: Cigarettes   Smokeless tobacco: Never  Vaping Use   Vaping status: Never Used  Substance Use Topics   Alcohol use: Yes    Alcohol/week: 8.0 standard drinks of alcohol    Types: 8 Cans of beer per  week    Comment: occ   Drug use: Not Currently    Types: Marijuana, Cocaine    Comment: last use 12/2014     Allergies   Patient has no known allergies.   Review of Systems Review of Systems  Constitutional:  Positive for activity change. Negative for appetite change, fatigue and fever.  Respiratory:  Positive for shortness of breath (chronic). Negative for cough.   Cardiovascular:  Negative for chest pain.  Gastrointestinal:  Positive for abdominal pain. Negative for blood in stool, constipation, diarrhea, nausea and vomiting.     Physical Exam Triage Vital Signs ED Triage Vitals  Encounter Vitals Group     BP 09/22/23 1748 (!) 146/77     Systolic BP Percentile --      Diastolic BP Percentile --      Pulse Rate 09/22/23 1748 65     Resp 09/22/23 1748 18     Temp 09/22/23 1748 97.8 F (36.6 C)     Temp Source 09/22/23 1748 Oral     SpO2 09/22/23 1748 95 %     Weight 09/22/23 1747 157 lb (71.2 kg)     Height 09/22/23 1747 5' 6.5" (1.689 m)     Head Circumference --      Peak Flow --      Pain Score 09/22/23 1747 9     Pain Loc --      Pain Education --      Exclude from Growth Chart --    No data found.  Updated Vital Signs BP (!) 146/77 (BP Location: Left Arm)   Pulse 65   Temp 97.8 F (36.6 C) (Oral)   Resp 18   Ht 5' 6.5" (1.689 m)   Wt 157 lb (71.2 kg)   SpO2 95%   BMI 24.96 kg/m   Visual Acuity Right Eye Distance:   Left Eye Distance:   Bilateral Distance:    Right Eye Near:   Left Eye Near:    Bilateral Near:     Physical Exam Vitals reviewed.  Constitutional:      General: He is awake.     Appearance: Normal appearance. He is well-developed. He is not ill-appearing.     Comments: Very pleasant male appears stated age in no acute distress sitting comfortably in exam room  HENT:     Head: Normocephalic and atraumatic.     Mouth/Throat:     Pharynx: Uvula midline. No oropharyngeal exudate or posterior oropharyngeal erythema.   Cardiovascular:     Rate and Rhythm: Normal rate and regular rhythm.     Heart sounds: Normal heart sounds, S1 normal and S2 normal. No  murmur heard. Pulmonary:     Effort: Pulmonary effort is normal.     Breath sounds: Normal breath sounds. No stridor. No wheezing, rhonchi or rales.     Comments: Clear to auscultation bilaterally Abdominal:     General: Bowel sounds are normal.     Palpations: Abdomen is soft.     Tenderness: There is generalized abdominal tenderness and tenderness in the right upper quadrant, epigastric area and left upper quadrant. There is no right CVA tenderness, left CVA tenderness, guarding or rebound.     Comments: Tenderness palpation throughout epigastrium.  No evidence of acute abdomen on physical exam.  Genitourinary:    Rectum: No tenderness, anal fissure or external hemorrhoid. Normal anal tone.     Comments: Tanya, CMA present as chaperone during exam.  Neurological:     Mental Status: He is alert.  Psychiatric:        Behavior: Behavior is cooperative.      UC Treatments / Results  Labs (all labs ordered are listed, but only abnormal results are displayed) Labs Reviewed  CBC WITH DIFFERENTIAL/PLATELET  COMPREHENSIVE METABOLIC PANEL  LIPASE  POC HEMOCCULT BLD/STL (OFFICE/1-CARD/DIAGNOSTIC)    EKG   Radiology No results found.  Procedures Procedures (including critical care time)  Medications Ordered in UC Medications  alum & mag hydroxide-simeth (MAALOX/MYLANTA) 200-200-20 MG/5ML suspension 30 mL (30 mLs Oral Given 09/22/23 1937)    Initial Impression / Assessment and Plan / UC Course  I have reviewed the triage vital signs and the nursing notes.  Pertinent labs & imaging results that were available during my care of the patient were reviewed by me and considered in my medical decision making (see chart for details).     Patient is well-appearing, afebrile, nontoxic, nontachycardic.  Vital signs and physical exam are reassuring  with no indication for emergent evaluation or imaging.  He did report some dark stools but had a negative Hemoccult in clinic.  I suspect this is related to Pepto-Bismol use and he was encouraged to discontinue this medication and see if the dark color of his stools resolves.  He was given Maalox with improvement of symptoms.  Concern for gastritis given his clinical presentation.  Will start Carafate and Pepcid.  These medications were dose adjusted based on his history of CKD; last metabolic panel from 07/16/2023 showed creatinine of 1.15 and calculated creatinine clearance of 61.07 mL/min.  He was encouraged to eat a bland diet and drink plenty of fluids.  Basic labs including CBC, CMP, lipase were obtained and are pending.  Discussed that if his symptoms are not improving he is to follow-up with gastroenterology to consider endoscopy was given contact information for local provider with instruction to call to schedule appointment.  Discussed that if he has any worsening or changing symptoms he needs to be seen immediately.  Strict return precautions given.  All questions answered patient satisfaction.  Final Clinical Impressions(s) / UC Diagnoses   Final diagnoses:  Acute gastritis, presence of bleeding unspecified, unspecified gastritis type  Dark stools  Upper abdominal pain     Discharge Instructions      You did not have any blood in your stool.  I am glad that you are feeling better after the medication.  Start Pepcid at bedtime.  Use sucralfate before each meal and before bed for the next week.  Eat a bland diet and avoid spicy/acidic/fatty foods.  Avoid alcohol and NSAIDs (aspirin, ibuprofen/Advil, naproxen/Aleve).  If your symptoms are not improving  please follow-up with gastroenterology; call to schedule an appointment.  If anything worsens and you have bloody stools, black stools, nausea, vomiting, chest pain, shortness of breath, severe abdominal pain you need to go to the emergency  room.     ED Prescriptions     Medication Sig Dispense Auth. Provider   sucralfate (CARAFATE) 1 g tablet Take 1 tablet (1 g total) by mouth 4 (four) times daily -  with meals and at bedtime. 28 tablet Cheyanna Strick K, PA-C   famotidine (PEPCID) 20 MG tablet Take 1 tablet (20 mg total) by mouth at bedtime. 30 tablet Forrestine Lecrone, Noberto Retort, PA-C      PDMP not reviewed this encounter.   Jeani Hawking, PA-C 09/22/23 2028

## 2023-09-24 LAB — CBC WITH DIFFERENTIAL/PLATELET
Basophils Absolute: 0.1 10*3/uL (ref 0.0–0.2)
Basos: 1 %
EOS (ABSOLUTE): 0.3 10*3/uL (ref 0.0–0.4)
Eos: 3 %
Hematocrit: 44.5 % (ref 37.5–51.0)
Hemoglobin: 14.4 g/dL (ref 13.0–17.7)
Immature Grans (Abs): 0 10*3/uL (ref 0.0–0.1)
Immature Granulocytes: 0 %
Lymphocytes Absolute: 2.4 10*3/uL (ref 0.7–3.1)
Lymphs: 22 %
MCH: 29.2 pg (ref 26.6–33.0)
MCHC: 32.4 g/dL (ref 31.5–35.7)
MCV: 90 fL (ref 79–97)
Monocytes Absolute: 0.9 10*3/uL (ref 0.1–0.9)
Monocytes: 8 %
Neutrophils Absolute: 7.1 10*3/uL — ABNORMAL HIGH (ref 1.4–7.0)
Neutrophils: 66 %
Platelets: 295 10*3/uL (ref 150–450)
RBC: 4.93 x10E6/uL (ref 4.14–5.80)
RDW: 12.4 % (ref 11.6–15.4)
WBC: 10.8 10*3/uL (ref 3.4–10.8)

## 2023-09-24 LAB — COMPREHENSIVE METABOLIC PANEL
ALT: 20 [IU]/L (ref 0–44)
AST: 23 [IU]/L (ref 0–40)
Albumin: 4.5 g/dL (ref 3.9–4.9)
Alkaline Phosphatase: 73 [IU]/L (ref 44–121)
BUN/Creatinine Ratio: 7 — ABNORMAL LOW (ref 10–24)
BUN: 9 mg/dL (ref 8–27)
Bilirubin Total: 0.7 mg/dL (ref 0.0–1.2)
CO2: 22 mmol/L (ref 20–29)
Calcium: 9.5 mg/dL (ref 8.6–10.2)
Chloride: 105 mmol/L (ref 96–106)
Creatinine, Ser: 1.25 mg/dL (ref 0.76–1.27)
Globulin, Total: 2.6 g/dL (ref 1.5–4.5)
Glucose: 81 mg/dL (ref 70–99)
Potassium: 4.5 mmol/L (ref 3.5–5.2)
Sodium: 144 mmol/L (ref 134–144)
Total Protein: 7.1 g/dL (ref 6.0–8.5)
eGFR: 62 mL/min/{1.73_m2} (ref >59)

## 2023-09-24 LAB — LIPASE: Lipase: 20 U/L (ref 13–78)

## 2023-09-27 ENCOUNTER — Other Ambulatory Visit: Payer: No Typology Code available for payment source

## 2023-10-06 ENCOUNTER — Ambulatory Visit
Admission: RE | Admit: 2023-10-06 | Discharge: 2023-10-06 | Disposition: A | Discharge status: Home / Self Care | Payer: No Typology Code available for payment source | Source: Ambulatory Visit | Attending: Family Medicine | Admitting: Family Medicine

## 2023-10-06 DIAGNOSIS — G8929 Other chronic pain: Secondary | ICD-10-CM

## 2023-10-06 DIAGNOSIS — M7581 Other shoulder lesions, right shoulder: Secondary | ICD-10-CM | POA: Diagnosis not present

## 2023-10-06 DIAGNOSIS — M75111 Incomplete rotator cuff tear or rupture of right shoulder, not specified as traumatic: Secondary | ICD-10-CM | POA: Diagnosis not present

## 2023-10-09 DIAGNOSIS — R1013 Epigastric pain: Secondary | ICD-10-CM | POA: Diagnosis not present

## 2023-10-09 DIAGNOSIS — R195 Other fecal abnormalities: Secondary | ICD-10-CM | POA: Diagnosis not present

## 2023-11-12 DIAGNOSIS — M75101 Unspecified rotator cuff tear or rupture of right shoulder, not specified as traumatic: Secondary | ICD-10-CM | POA: Diagnosis not present

## 2023-11-29 ENCOUNTER — Emergency Department (HOSPITAL_BASED_OUTPATIENT_CLINIC_OR_DEPARTMENT_OTHER)
Admission: EM | Admit: 2023-11-29 | Discharge: 2023-11-29 | Disposition: A | Discharge status: Home / Self Care | Payer: No Typology Code available for payment source

## 2023-11-29 ENCOUNTER — Encounter (HOSPITAL_BASED_OUTPATIENT_CLINIC_OR_DEPARTMENT_OTHER): Payer: Self-pay

## 2023-11-29 ENCOUNTER — Emergency Department (HOSPITAL_BASED_OUTPATIENT_CLINIC_OR_DEPARTMENT_OTHER): Payer: No Typology Code available for payment source

## 2023-11-29 ENCOUNTER — Other Ambulatory Visit: Payer: Self-pay

## 2023-11-29 DIAGNOSIS — R519 Headache, unspecified: Secondary | ICD-10-CM | POA: Diagnosis not present

## 2023-11-29 DIAGNOSIS — J432 Centrilobular emphysema: Secondary | ICD-10-CM | POA: Diagnosis not present

## 2023-11-29 DIAGNOSIS — G9389 Other specified disorders of brain: Secondary | ICD-10-CM | POA: Diagnosis not present

## 2023-11-29 DIAGNOSIS — Z79899 Other long term (current) drug therapy: Secondary | ICD-10-CM | POA: Diagnosis not present

## 2023-11-29 DIAGNOSIS — I10 Essential (primary) hypertension: Secondary | ICD-10-CM | POA: Insufficient documentation

## 2023-11-29 DIAGNOSIS — R001 Bradycardia, unspecified: Secondary | ICD-10-CM | POA: Diagnosis not present

## 2023-11-29 LAB — BASIC METABOLIC PANEL
Anion gap: 8 (ref 5–15)
BUN: 21 mg/dL (ref 8–23)
CO2: 25 mmol/L (ref 22–32)
Calcium: 9.1 mg/dL (ref 8.9–10.3)
Chloride: 106 mmol/L (ref 98–111)
Creatinine, Ser: 1.11 mg/dL (ref 0.61–1.24)
GFR, Estimated: >60 mL/min (ref >60)
Glucose, Bld: 86 mg/dL (ref 70–99)
Potassium: 4.3 mmol/L (ref 3.5–5.1)
Sodium: 139 mmol/L (ref 135–145)

## 2023-11-29 LAB — C-REACTIVE PROTEIN: CRP: 0.5 mg/dL (ref <1.0)

## 2023-11-29 LAB — SEDIMENTATION RATE: Sed Rate: 12 mm/h (ref 0–16)

## 2023-11-29 LAB — CBC
HCT: 40 % (ref 39.0–52.0)
Hemoglobin: 13.3 g/dL (ref 13.0–17.0)
MCH: 28.9 pg (ref 26.0–34.0)
MCHC: 33.3 g/dL (ref 30.0–36.0)
MCV: 87 fL (ref 80.0–100.0)
Platelets: 260 10*3/uL (ref 150–400)
RBC: 4.6 MIL/uL (ref 4.22–5.81)
RDW: 13.1 % (ref 11.5–15.5)
WBC: 11 10*3/uL — ABNORMAL HIGH (ref 4.0–10.5)
nRBC: 0 % (ref 0.0–0.2)

## 2023-11-29 MED ORDER — ONDANSETRON HCL 4 MG/2ML IJ SOLN
4.0000 mg | Freq: Once | INTRAMUSCULAR | Status: DC
  Filled 2023-11-29: qty 2

## 2023-11-29 MED ORDER — KETOROLAC TROMETHAMINE 15 MG/ML IJ SOLN
15.0000 mg | Freq: Once | INTRAMUSCULAR | Status: AC
  Administered 2023-11-29: 15 mg via INTRAVENOUS
  Filled 2023-11-29: qty 1

## 2023-11-29 MED ORDER — HYDROMORPHONE HCL 1 MG/ML IJ SOLN
1.0000 mg | Freq: Once | INTRAMUSCULAR | Status: AC
  Administered 2023-11-29: 1 mg via INTRAVENOUS
  Filled 2023-11-29: qty 1

## 2023-11-29 MED ORDER — SODIUM CHLORIDE 0.9 % IV BOLUS
1000.0000 mL | Freq: Once | INTRAVENOUS | Status: AC
  Administered 2023-11-29: 1000 mL via INTRAVENOUS

## 2023-11-29 MED ORDER — PROCHLORPERAZINE EDISYLATE 10 MG/2ML IJ SOLN
5.0000 mg | Freq: Once | INTRAMUSCULAR | Status: AC
  Administered 2023-11-29: 5 mg via INTRAVENOUS
  Filled 2023-11-29: qty 2

## 2023-11-29 MED ORDER — DIAZEPAM 5 MG/ML IJ SOLN
2.5000 mg | Freq: Once | INTRAMUSCULAR | Status: AC
  Administered 2023-11-29: 2.5 mg via INTRAVENOUS
  Filled 2023-11-29: qty 2

## 2023-11-29 MED ORDER — IOHEXOL 350 MG/ML SOLN
75.0000 mL | Freq: Once | INTRAVENOUS | Status: AC | PRN
  Administered 2023-11-29: 75 mL via INTRAVENOUS

## 2023-11-29 NOTE — ED Notes (Signed)
 ED Provider at bedside.

## 2023-11-29 NOTE — ED Provider Notes (Signed)
[facility] Provider Note   CSN: [id number] Arrival date & time: 11/29/23  4098     History {Add pertinent medical, surgical, social history, OB history to HPI:1} Chief Complaint  Patient presents with   Headache    Scott Tate is a 68 y.o. male.  68 year old male with past medical history of hypertension, hyperlipidemia, and solitary kidney presenting to the emergency department today with headache.  The patient states that he started with a headache around 4 PM yesterday.  Reports is gradually worsened overnight.  He states that is gotten to the point that he is having pain mostly on the left side of his head and it is worse when he moves his neck.  The patient denies any fevers with this.  He states that he had similar headaches last week.  He had 2 separate episodes but these both improved with Tylenol.  He reports that he is taking this today and it did not really help.  The patient denies any fevers.  Denies any nausea or vomiting.  He denies any focal weakness, numbness, or tingling.  He states that before the past week he has not really had headaches like this.   Headache      Home Medications Prior to Admission medications   Medication Sig Start Date End Date Taking? Authorizing Provider  albuterol (VENTOLIN HFA) 108 (90 Base) MCG/ACT inhaler Inhale 2 puffs into the lungs every 6 (six) hours as needed for wheezing or shortness of breath.    [provider]  benzonatate (TESSALON) 100 MG capsule Take 1 capsule (100 mg total) by mouth every 8 (eight) hours. 07/16/23   Faith Rogue, DO  cyclobenzaprine (FLEXERIL) 10 MG tablet Take 1 tablet (10 mg total) by mouth 3 (three) times daily as needed for muscle spasms. 06/13/22   Geoffery Lyons, MD  docusate sodium (COLACE) 250 MG capsule Take 1 capsule (250 mg total) by mouth daily. 02/23/21   Derwood Kaplan, MD  famotidine (PEPCID) 20 MG tablet Take 1 tablet (20 mg total) by mouth  at bedtime. 09/22/23   Raspet, Noberto Retort, PA-C  guaiFENesin (ROBITUSSIN) 100 MG/5ML liquid Take 5 mLs by mouth every 6 (six) hours as needed for cough or to loosen phlegm. 07/16/23   Faith Rogue, DO  lidocaine (XYLOCAINE) 2 % jelly Apply 1 application topically 3 (three) times daily as needed. Max use for 7 days 02/23/21   Derwood Kaplan, MD  Lidocaine, Anorectal, 5 % CREA Apply 1 application topically daily as needed (pain).    [provider]  losartan (COZAAR) 25 MG tablet Take 25 mg by mouth daily. 09/27/20   [provider]  omeprazole (PRILOSEC) 20 MG capsule Take 20 mg by mouth daily. 02/08/21   [provider]  Pramox-PE-Glycerin-Petrolatum (PREPARATION H) 1-0.25-14.4-15 % CREA Apply 1 application topically 3 (three) times daily.    [provider]  predniSONE (DELTASONE) 10 MG tablet Take 2 tablets (20 mg total) by mouth 2 (two) times daily. 06/13/22   Geoffery Lyons, MD  sucralfate (CARAFATE) 1 g tablet Take 1 tablet (1 g total) by mouth 4 (four) times daily -  with meals and at bedtime. 09/22/23   Raspet, Noberto Retort, PA-C  tamsulosin (FLOMAX) 0.4 MG CAPS capsule Take 0.4 mg by mouth daily. 07/14/20   [provider]  traMADol (ULTRAM) 50 MG tablet Take 50 mg by mouth every 6 (six) hours as needed for severe pain (pain). 10/17/20   [provider]      Allergies    Patient has no known allergies.    Review of Systems   Review of Systems  Neurological:  Positive for headaches.  All other systems reviewed and are negative.   Physical Exam Updated Vital Signs BP (!) 106/51   Pulse (!) 54   Temp (!) 97.5 F (36.4 C) (Oral)   Resp 14   Ht 5\' 6"  (1.676 m)   Wt 70.3 kg   SpO2 100%   BMI 25.02 kg/m  Physical Exam Vitals and nursing note reviewed.   Gen: NAD Eyes: PERRL, EOMI HEENT: no oropharyngeal swelling Neck: trachea midline, the patient is tender over the left paraspinal musculature, no midline tenderness is noted, the patient  is able to range his neck with pain, he is holding his neck to the left and looking downward Resp: clear to auscultation bilaterally Card: RRR, no murmurs, rubs, or gallops Abd: nontender, nondistended Extremities: no calf tenderness, no edema Vascular: 2+ radial pulses bilaterally, 2+ DP pulses bilaterally Neuro: NIH stroke scale of 0 Skin: no rashes Psyc: acting appropriately   ED Results / Procedures / Treatments   Labs (all labs ordered are listed, but only abnormal results are displayed) Labs Reviewed  CBC - Abnormal; Notable for the following components:      Result Value   WBC 11.0 (*)    All other components within normal limits  BASIC METABOLIC PANEL  SEDIMENTATION RATE  C-REACTIVE PROTEIN    EKG EKG Interpretation Date/Time:  Saturday November 29 2023 07:31:38 EST Ventricular Rate:  51 PR Interval:  143 QRS Duration:  101 QT Interval:  450 QTC Calculation: 415 R Axis:   84  Text Interpretation: Sinus rhythm Borderline right axis deviation Confirmed by Beckey Downing [phone]) on 11/29/2023 7:35:19 AM  Radiology CT ANGIO HEAD NECK W WO CM Result Date: 11/29/2023 CLINICAL DATA:  Severe headache, stroke workup. EXAM: CT ANGIOGRAPHY HEAD AND NECK WITH AND WITHOUT CONTRAST TECHNIQUE: Multidetector CT imaging of the head and neck was performed using the standard protocol during bolus administration of intravenous contrast. Multiplanar CT image reconstructions and MIPs were obtained to evaluate the vascular anatomy. Carotid stenosis measurements (when applicable) are obtained utilizing NASCET criteria, using the distal internal carotid diameter as the denominator. RADIATION DOSE REDUCTION: This exam was performed according to the departmental dose-optimization program which includes automated exposure control, adjustment of the mA and/or kV according to patient size and/or use of iterative reconstruction technique. CONTRAST:  75mL OMNIPAQUE IOHEXOL 350 MG/ML SOLN COMPARISON:  Head CT  01/17/2023 FINDINGS: CT HEAD FINDINGS Brain: No evidence of acute infarction, hemorrhage, hydrocephalus, extra-axial collection or mass lesion/mass effect. Coarse calcification in the right frontal white matter is stable and not associated with any swelling or masslike features. Vascular: See below Skull: Normal. Negative for fracture or focal lesion. Sinuses/Orbits: No acute finding. Review of the MIP images confirms the above findings CTA NECK FINDINGS Aortic arch: Unremarkable Right carotid system: Vessels are smoothly contoured and widely patent. There may be intimal wall thickening. Left carotid system: Vessels are smoothly contoured and widely patent. There may be intimal wall thickening. Vertebral arteries: Vessels are smoothly contoured and widely patent. There may be intimal wall thickening. Skeleton: Negative. Other neck: No acute finding or cause for headaches seen. Upper chest: Clear apical lungs.  Centrilobular emphysema Review of the MIP images confirms the above findings CTA HEAD FINDINGS Anterior circulation: Vessels are smoothly contoured and widely patent. No significant atheromatous change. Negative  for aneurysm or vascular malformation. Posterior circulation: Fenestrated proximal basilar. No stenosis, branch occlusion, beading, or aneurysm. Venous sinuses: Unremarkable for arterial timing. Anatomic variants: None significant Review of the MIP images confirms the above findings IMPRESSION: Negative CTA.  No explanation for symptoms. Electronically Signed   By: Tiburcio Pea M.D.   On: 11/29/2023 09:12    Procedures Procedures  {Document cardiac monitor, telemetry assessment procedure when appropriate:1}  Medications Ordered in ED Medications  HYDROmorphone (DILAUDID) injection 1 mg (1 mg Intravenous Given 11/29/23 0743)  prochlorperazine (COMPAZINE) injection 5 mg (5 mg Intravenous Given 11/29/23 0748)  iohexol (OMNIPAQUE) 350 MG/ML injection 75 mL (75 mLs Intravenous Contrast Given  11/29/23 0832)  ketorolac (TORADOL) 15 MG/ML injection 15 mg (15 mg Intravenous Given 11/29/23 6213)  diazepam (VALIUM) injection 2.5 mg (2.5 mg Intravenous Given 11/29/23 0939)  sodium chloride 0.9 % bolus 1,000 mL (1,000 mLs Intravenous New Bag/Given 11/29/23 1344)    ED Course/ Medical Decision Making/ A&P   {   Click here for ABCD2, HEART and other calculatorsREFRESH Note before signing :1}                              Medical Decision Making 68 year old male with past medical history of hypertension and solitary kidney presents the emergency department today with headache.  The patient does not have any focal neurological symptoms here on exam.  He is afebrile here and denies a history of fevers this week so suspicion for meningitis is relatively low.  I will obtain basic labs here.  Will obtain a CT angiogram of his head and neck to evaluate for subarachnoid hemorrhage although suspicion for this is also relatively low with him having multiple episodes this past week regarding this.  This may be due to a tension headache with the symptoms going to his neck or torticollis.  The CT angiogram also evaluate for vertebral artery dissection although the patient is denying any significant dizziness with this suspicion for this is also relatively low.  I will give the patient Dilaudid for pain as well as Compazine.  I will reevaluate for ultimate disposition.  The patient's labs here are reassuring.  CT scan is unremarkable.  His symptoms resolved here.  The patient was bradycardic here but blood pressures remained stable.  He states that he is feeling back to normal.  His CRP is pending but he does not want a wait here for this as he is feeling better.  He is ultimately discharged through shared decision making.  His ESR is totally negative.  I will try to follow-up on this tomorrow or later tonight.  He is discharged with return precautions.  Amount and/or Complexity of Data Reviewed Labs:  ordered. Radiology: ordered.  Risk Prescription drug management.     {Document critical care time when appropriate:1} {Document review of labs and clinical decision tools ie heart score, Chads2Vasc2 etc:1}  {Document your independent review of radiology images, and any outside records:1} {Document your discussion with family members, caretakers, and with consultants:1} {Document social determinants of health affecting pt's care:1} {Document your decision making why or why not admission, treatments were needed:1} Final Clinical Impression(s) / ED Diagnoses Final diagnoses:  Nonintractable headache, unspecified chronicity pattern, unspecified headache type    Rx / DC Orders ED Discharge Orders     None

## 2023-11-29 NOTE — ED Notes (Signed)
Pt has been sinus bradycardic. HR now as low as 39. Dr. Rhae Hammock aware. BP 118/60.   Robina Ade, RN

## 2023-11-29 NOTE — Discharge Instructions (Addendum)
 Your workup today was reassuring.  Please follow-up with your doctor and return to the ER for worsening symptoms.

## 2023-11-29 NOTE — ED Triage Notes (Signed)
Pt with severe 10/10 HA starting yesterday at 1600. Arrives with a limp and slumped to the left side. Pt states he is unable to straighten due to severe pain. Has a left gaze preference as well. States the slump started this morning about 0300.   Robina Ade, RN

## 2023-12-01 DIAGNOSIS — R519 Headache, unspecified: Secondary | ICD-10-CM | POA: Diagnosis not present

## 2023-12-01 DIAGNOSIS — K219 Gastro-esophageal reflux disease without esophagitis: Secondary | ICD-10-CM | POA: Diagnosis not present

## 2023-12-01 DIAGNOSIS — M545 Low back pain, unspecified: Secondary | ICD-10-CM | POA: Diagnosis not present

## 2023-12-01 DIAGNOSIS — N400 Enlarged prostate without lower urinary tract symptoms: Secondary | ICD-10-CM | POA: Diagnosis not present

## 2023-12-01 DIAGNOSIS — M75101 Unspecified rotator cuff tear or rupture of right shoulder, not specified as traumatic: Secondary | ICD-10-CM | POA: Diagnosis not present

## 2023-12-01 DIAGNOSIS — N1831 Chronic kidney disease, stage 3a: Secondary | ICD-10-CM | POA: Diagnosis not present

## 2023-12-01 DIAGNOSIS — I10 Essential (primary) hypertension: Secondary | ICD-10-CM | POA: Diagnosis not present

## 2023-12-01 DIAGNOSIS — K644 Residual hemorrhoidal skin tags: Secondary | ICD-10-CM | POA: Diagnosis not present

## 2024-04-16 ENCOUNTER — Encounter (HOSPITAL_BASED_OUTPATIENT_CLINIC_OR_DEPARTMENT_OTHER): Payer: Self-pay

## 2024-04-16 ENCOUNTER — Other Ambulatory Visit: Payer: Self-pay

## 2024-04-16 ENCOUNTER — Emergency Department (HOSPITAL_BASED_OUTPATIENT_CLINIC_OR_DEPARTMENT_OTHER)
Admission: EM | Admit: 2024-04-16 | Discharge: 2024-04-16 | Disposition: A | Discharge status: Home / Self Care | Attending: Emergency Medicine | Admitting: Emergency Medicine

## 2024-04-16 DIAGNOSIS — W57XXXA Bitten or stung by nonvenomous insect and other nonvenomous arthropods, initial encounter: Secondary | ICD-10-CM | POA: Diagnosis not present

## 2024-04-16 DIAGNOSIS — S80862A Insect bite (nonvenomous), left lower leg, initial encounter: Secondary | ICD-10-CM | POA: Diagnosis not present

## 2024-04-16 MED ORDER — CEPHALEXIN 500 MG PO CAPS: 500.0000 mg | ORAL_CAPSULE | Freq: Four times a day (QID) | ORAL | 0 refills | Status: AC

## 2024-04-16 MED ORDER — CEPHALEXIN 250 MG PO CAPS
500.0000 mg | ORAL_CAPSULE | Freq: Once | ORAL | Status: AC
  Administered 2024-04-16: 500 mg via ORAL
  Filled 2024-04-16: qty 2

## 2024-04-16 NOTE — ED Provider Notes (Signed)
 [place] EMERGENCY DEPARTMENT AT [facility] [place] Provider Note   CSN: [id number] Arrival date & time: 04/16/24  8657     Patient presents with: Insect Bite   Scott Tate is a 68 y.o. male.   Patient presents to the emergency department for evaluation of swelling of the left leg after suffering a bite or sting on the upper calf yesterday while mowing the lawn.  No tongue swelling, throat swelling or difficulty breathing.       Prior to Admission medications   Medication Sig Start Date End Date Taking? Authorizing Provider  cephALEXin (KEFLEX) 500 MG capsule Take 1 capsule (500 mg total) by mouth 4 (four) times daily. 04/16/24  Yes Amilio Zehnder, Marine Sia, MD  albuterol  (VENTOLIN  HFA) 108 (90 Base) MCG/ACT inhaler Inhale 2 puffs into the lungs every 6 (six) hours as needed for wheezing or shortness of breath.    [provider]  benzonatate  (TESSALON ) 100 MG capsule Take 1 capsule (100 mg total) by mouth every 8 (eight) hours. 07/16/23   Aurora Lees, DO  cyclobenzaprine  (FLEXERIL ) 10 MG tablet Take 1 tablet (10 mg total) by mouth 3 (three) times daily as needed for muscle spasms. 06/13/22   Orvilla Blander, MD  docusate sodium  (COLACE) 250 MG capsule Take 1 capsule (250 mg total) by mouth daily. 02/23/21   Deatra Face, MD  famotidine  (PEPCID ) 20 MG tablet Take 1 tablet (20 mg total) by mouth at bedtime. 09/22/23   Raspet, Betsey Brow, PA-C  guaiFENesin  (ROBITUSSIN) 100 MG/5ML liquid Take 5 mLs by mouth every 6 (six) hours as needed for cough or to loosen phlegm. 07/16/23   Aurora Lees, DO  lidocaine  (XYLOCAINE ) 2 % jelly Apply 1 application topically 3 (three) times daily as needed. Max use for 7 days 02/23/21   Deatra Face, MD  Lidocaine , Anorectal, 5 % CREA Apply 1 application topically daily as needed (pain).    [provider]  losartan (COZAAR) 25 MG tablet Take 25 mg by mouth daily. 09/27/20   [provider]  omeprazole  (PRILOSEC) 20 MG capsule  Take 20 mg by mouth daily. 02/08/21   [provider]  Pramox-PE-Glycerin-Petrolatum (PREPARATION H) 1-0.25-14.4-15 % CREA Apply 1 application topically 3 (three) times daily.    [provider]  predniSONE  (DELTASONE ) 10 MG tablet Take 2 tablets (20 mg total) by mouth 2 (two) times daily. 06/13/22   Orvilla Blander, MD  sucralfate  (CARAFATE ) 1 g tablet Take 1 tablet (1 g total) by mouth 4 (four) times daily -  with meals and at bedtime. 09/22/23   Raspet, Erin K, PA-C  tamsulosin  (FLOMAX ) 0.4 MG CAPS capsule Take 0.4 mg by mouth daily. 07/14/20   [provider]  traMADol  (ULTRAM ) 50 MG tablet Take 50 mg by mouth every 6 (six) hours as needed for severe pain (pain). 10/17/20   [provider]    Allergies: Patient has no known allergies.    Review of Systems  Updated Vital Signs BP (!) 147/70   Pulse (!) 56   Temp 98 F (36.7 C) (Oral)   Resp (!) 24   Ht 5' 5.5 (1.664 m)   Wt 68 kg   SpO2 96%   BMI 24.58 kg/m   Physical Exam Constitutional:      Appearance: Normal appearance.  HENT:     Head: Normocephalic and atraumatic.   Eyes:     Pupils: Pupils are equal, round, and reactive to light.    Cardiovascular:  Rate and Rhythm: Normal rate and regular rhythm.     Pulses:          Dorsalis pedis pulses are 2+ on the left side.  Pulmonary:     Effort: Pulmonary effort is normal.     Breath sounds: Normal breath sounds.   Musculoskeletal:     Cervical back: Normal range of motion.     Left ankle: Swelling present.       Legs:   Neurological:     Mental Status: He is alert.     (all labs ordered are listed, but only abnormal results are displayed) Labs Reviewed - No data to display  EKG: None  Radiology: No results found.   Procedures   Medications Ordered in the ED  cephALEXin (KEFLEX) capsule 500 mg (has no administration in time range)                                    Medical Decision Making  Presents to the  emergency department for evaluation of leg swelling.  Patient has evidence of a bite or sting on the upper medial calf area.  There is some swelling of the lower leg and ankle, likely dependent edema from swelling from the local reaction.  Cannot rule out infection.  No sign of systemic allergic reaction.  No calf tenderness or signs of DVT.     Final diagnoses:  Insect bite of left lower leg, initial encounter    ED Discharge Orders          Ordered    cephALEXin (KEFLEX) 500 MG capsule  4 times daily        04/16/24 0115               Bernarr Longsworth J, MD 04/16/24 [phone]

## 2024-04-16 NOTE — ED Triage Notes (Signed)
 Pt reports cutting grass on Wednesday when he was bitten on LLE but something. Pt reports swelling and itching. No bite mark noted, no redness, no obvious swelling.

## 2024-04-20 DIAGNOSIS — H52223 Regular astigmatism, bilateral: Secondary | ICD-10-CM | POA: Diagnosis not present

## 2024-04-20 DIAGNOSIS — H524 Presbyopia: Secondary | ICD-10-CM | POA: Diagnosis not present

## 2024-04-20 DIAGNOSIS — H5203 Hypermetropia, bilateral: Secondary | ICD-10-CM | POA: Diagnosis not present

## 2024-06-08 DIAGNOSIS — M25511 Pain in right shoulder: Secondary | ICD-10-CM | POA: Diagnosis not present

## 2024-07-10 ENCOUNTER — Other Ambulatory Visit: Payer: Self-pay

## 2024-07-10 ENCOUNTER — Emergency Department (HOSPITAL_BASED_OUTPATIENT_CLINIC_OR_DEPARTMENT_OTHER)

## 2024-07-10 ENCOUNTER — Emergency Department (HOSPITAL_BASED_OUTPATIENT_CLINIC_OR_DEPARTMENT_OTHER)
Admission: EM | Admit: 2024-07-10 | Discharge: 2024-07-10 | Disposition: A | Discharge status: Home / Self Care | Attending: Emergency Medicine | Admitting: Emergency Medicine

## 2024-07-10 ENCOUNTER — Encounter (HOSPITAL_BASED_OUTPATIENT_CLINIC_OR_DEPARTMENT_OTHER): Payer: Self-pay

## 2024-07-10 DIAGNOSIS — F17210 Nicotine dependence, cigarettes, uncomplicated: Secondary | ICD-10-CM | POA: Insufficient documentation

## 2024-07-10 DIAGNOSIS — Z79899 Other long term (current) drug therapy: Secondary | ICD-10-CM | POA: Insufficient documentation

## 2024-07-10 DIAGNOSIS — J181 Lobar pneumonia, unspecified organism: Secondary | ICD-10-CM | POA: Insufficient documentation

## 2024-07-10 DIAGNOSIS — I10 Essential (primary) hypertension: Secondary | ICD-10-CM | POA: Diagnosis not present

## 2024-07-10 DIAGNOSIS — J168 Pneumonia due to other specified infectious organisms: Secondary | ICD-10-CM | POA: Diagnosis not present

## 2024-07-10 DIAGNOSIS — J189 Pneumonia, unspecified organism: Secondary | ICD-10-CM

## 2024-07-10 DIAGNOSIS — R059 Cough, unspecified: Secondary | ICD-10-CM | POA: Diagnosis present

## 2024-07-10 DIAGNOSIS — R0602 Shortness of breath: Secondary | ICD-10-CM | POA: Diagnosis not present

## 2024-07-10 LAB — CBC
HCT: 40.5 % (ref 39.0–52.0)
Hemoglobin: 13.4 g/dL (ref 13.0–17.0)
MCH: 28.7 pg (ref 26.0–34.0)
MCHC: 33.1 g/dL (ref 30.0–36.0)
MCV: 86.7 fL (ref 80.0–100.0)
Platelets: 237 K/uL (ref 150–400)
RBC: 4.67 MIL/uL (ref 4.22–5.81)
RDW: 13.5 % (ref 11.5–15.5)
WBC: 9.7 K/uL (ref 4.0–10.5)
nRBC: 0 % (ref 0.0–0.2)

## 2024-07-10 LAB — BASIC METABOLIC PANEL WITH GFR
Anion gap: 12 (ref 5–15)
BUN: 18 mg/dL (ref 8–23)
CO2: 24 mmol/L (ref 22–32)
Calcium: 9.2 mg/dL (ref 8.9–10.3)
Chloride: 105 mmol/L (ref 98–111)
Creatinine, Ser: 1.21 mg/dL (ref 0.61–1.24)
GFR, Estimated: >60 mL/min (ref >60)
Glucose, Bld: 83 mg/dL (ref 70–99)
Potassium: 4 mmol/L (ref 3.5–5.1)
Sodium: 140 mmol/L (ref 135–145)

## 2024-07-10 LAB — RESP PANEL BY RT-PCR (RSV, FLU A&B, COVID)  RVPGX2
Influenza A by PCR: NEGATIVE
Influenza B by PCR: NEGATIVE
Resp Syncytial Virus by PCR: NEGATIVE
SARS Coronavirus 2 by RT PCR: NEGATIVE

## 2024-07-10 MED ORDER — PREDNISONE 20 MG PO TABS
40.0000 mg | ORAL_TABLET | Freq: Once | ORAL | Status: AC
  Administered 2024-07-10: 40 mg via ORAL
  Filled 2024-07-10: qty 2

## 2024-07-10 MED ORDER — AZITHROMYCIN 250 MG PO TABS: 500.0000 mg | ORAL_TABLET | Freq: Every day | ORAL | 0 refills | Status: AC

## 2024-07-10 MED ORDER — PREDNISONE 20 MG PO TABS: 40.0000 mg | ORAL_TABLET | Freq: Every day | ORAL | 0 refills | Status: AC

## 2024-07-10 MED ORDER — AZITHROMYCIN 250 MG PO TABS
500.0000 mg | ORAL_TABLET | Freq: Once | ORAL | Status: AC
  Administered 2024-07-10: 500 mg via ORAL
  Filled 2024-07-10: qty 2

## 2024-07-10 MED ORDER — NICOTINE 14 MG/24HR TD PT24: 14.0000 mg | MEDICATED_PATCH | Freq: Every day | TRANSDERMAL | 0 refills | Status: AC

## 2024-07-10 MED ORDER — ALBUTEROL SULFATE HFA 108 (90 BASE) MCG/ACT IN AERS
1.0000 | INHALATION_SPRAY | Freq: Once | RESPIRATORY_TRACT | Status: AC
  Administered 2024-07-10: 1 via RESPIRATORY_TRACT
  Filled 2024-07-10: qty 6.7

## 2024-07-10 NOTE — ED Provider Notes (Signed)
 [place] EMERGENCY DEPARTMENT AT [facility] [place] Provider Note   CSN: [id number] Arrival date & time: 07/10/24  1555     Patient presents with: Shortness of Breath   Scott Tate is a 68 y.o. male with PMHx HTN who presents to the ED concerned for SOB and productive cough x1 week. Patient has been taking Robitussin and Nyquil without much relief. Patient endorsing rhinorrhea and congestion. Also with sore throat. Denies fever, chest pain, nausea, vomiting, diarrhea. SOB occurs when walking.   Patient does smoke tobacco - endorses 1-2 packs daily.  Denies recent surgery/immobilization, hx DVT/PE, hemoptysis, hx cancer in the past 6 months, calf swelling/tenderness.     Shortness of Breath      Prior to Admission medications   Medication Sig Start Date End Date Taking? Authorizing Provider  azithromycin  (ZITHROMAX ) 250 MG tablet Take 2 tablets (500 mg total) by mouth daily for 2 days. Take first 2 tablets together, then 1 every day until finished. 07/10/24 07/12/24 Yes Fredie Majano, Nidia F, PA-C  nicotine  (NICODERM CQ  - DOSED IN MG/24 HOURS) 14 mg/24hr patch Place 1 patch (14 mg total) onto the skin daily. 07/10/24  Yes Hoy Nidia F, PA-C  predniSONE  (DELTASONE ) 20 MG tablet Take 2 tablets (40 mg total) by mouth daily. 07/10/24  Yes Hoy Nidia F, PA-C  albuterol  (VENTOLIN  HFA) 108 (90 Base) MCG/ACT inhaler Inhale 2 puffs into the lungs every 6 (six) hours as needed for wheezing or shortness of breath.    [provider]  benzonatate  (TESSALON ) 100 MG capsule Take 1 capsule (100 mg total) by mouth every 8 (eight) hours. 07/16/23   Kandis Perkins, DO  cephALEXin  (KEFLEX ) 500 MG capsule Take 1 capsule (500 mg total) by mouth 4 (four) times daily. 04/16/24   Haze Lonni PARAS, MD  cyclobenzaprine  (FLEXERIL ) 10 MG tablet Take 1 tablet (10 mg total) by mouth 3 (three) times daily as needed for muscle spasms. 06/13/22   Geroldine Berg, MD  docusate sodium   (COLACE) 250 MG capsule Take 1 capsule (250 mg total) by mouth daily. 02/23/21   Charlyn Sora, MD  famotidine  (PEPCID ) 20 MG tablet Take 1 tablet (20 mg total) by mouth at bedtime. 09/22/23   Raspet, Erin K, PA-C  guaiFENesin  (ROBITUSSIN) 100 MG/5ML liquid Take 5 mLs by mouth every 6 (six) hours as needed for cough or to loosen phlegm. 07/16/23   Kandis Perkins, DO  lidocaine  (XYLOCAINE ) 2 % jelly Apply 1 application topically 3 (three) times daily as needed. Max use for 7 days 02/23/21   Charlyn Sora, MD  Lidocaine , Anorectal, 5 % CREA Apply 1 application topically daily as needed (pain).    [provider]  losartan (COZAAR) 25 MG tablet Take 25 mg by mouth daily. 09/27/20   [provider]  omeprazole  (PRILOSEC) 20 MG capsule Take 20 mg by mouth daily. 02/08/21   [provider]  Pramox-PE-Glycerin-Petrolatum (PREPARATION H) 1-0.25-14.4-15 % CREA Apply 1 application topically 3 (three) times daily.    [provider]  sucralfate  (CARAFATE ) 1 g tablet Take 1 tablet (1 g total) by mouth 4 (four) times daily -  with meals and at bedtime. 09/22/23   Raspet, Erin K, PA-C  tamsulosin  (FLOMAX ) 0.4 MG CAPS capsule Take 0.4 mg by mouth daily. 07/14/20   [provider]  traMADol  (ULTRAM ) 50 MG tablet Take 50 mg by mouth every 6 (six) hours as needed for severe pain (pain). 10/17/20   [provider]    Allergies:  Patient has no known allergies.    Review of Systems  Respiratory:  Positive for shortness of breath.     Updated Vital Signs BP 116/67   Pulse (!) 42   Temp 98 F (36.7 C) (Oral)   Resp (!) 21   Ht 5' 5 (1.651 m)   Wt 72.6 kg   SpO2 97%   BMI 26.63 kg/m   Physical Exam Vitals and nursing note reviewed.  Constitutional:      General: He is not in acute distress.    Appearance: He is not ill-appearing or toxic-appearing.  HENT:     Head: Normocephalic and atraumatic.     Mouth/Throat:     Mouth: Mucous membranes are moist.      Pharynx: No oropharyngeal exudate or posterior oropharyngeal erythema.     Comments: +1 tonsills BL. Very mild posterior oropharyngeal erythema. No exudates. Eyes:     General: No scleral icterus.       Right eye: No discharge.        Left eye: No discharge.     Conjunctiva/sclera: Conjunctivae normal.  Cardiovascular:     Rate and Rhythm: Normal rate and regular rhythm.     Pulses: Normal pulses.     Heart sounds: Normal heart sounds. No murmur heard. Pulmonary:     Effort: Pulmonary effort is normal. No respiratory distress.     Breath sounds: Examination of the left-lower field reveals rhonchi. Rhonchi present. No wheezing or rales.  Abdominal:     Tenderness: There is no abdominal tenderness.  Musculoskeletal:     Right lower leg: No edema.     Left lower leg: No edema.     Comments: No calf tenderness or swelling.  Skin:    General: Skin is warm and dry.     Findings: No rash.  Neurological:     General: No focal deficit present.     Mental Status: He is alert and oriented to person, place, and time. Mental status is at baseline.  Psychiatric:        Mood and Affect: Mood normal.        Behavior: Behavior normal.     (all labs ordered are listed, but only abnormal results are displayed) Labs Reviewed  RESP PANEL BY RT-PCR (RSV, FLU A&B, COVID)  [id number]  BASIC METABOLIC PANEL WITH GFR  CBC    EKG: EKG Interpretation Date/Time:  Saturday July 10 2024 16:04:58 EDT Ventricular Rate:  63 PR Interval:  135 QRS Duration:  96 QT Interval:  348 QTC Calculation: 357 R Axis:   85  Text Interpretation: Sinus rhythm Confirmed by Ruthe Cornet [phone]) on 07/10/2024 5:48:01 PM  Radiology: [facility] Chest 2 View Result Date: 07/10/2024 EXAM: 2 VIEW(S) XRAY OF THE CHEST 07/10/2024 04:35:00 PM COMPARISON: 07/16/2023 Stable surgical clips in upper abdomen. CLINICAL HISTORY: Shortness of breath. Productive cough and congestion x 1 wk. Smoker. ?hx bronchitis. FINDINGS: LUNGS  AND PLEURA: No focal pulmonary opacity. No pulmonary edema. No pleural effusion. No pneumothorax. HEART AND MEDIASTINUM: No acute abnormality of the cardiac and mediastinal silhouettes. BONES AND SOFT TISSUES: No acute osseous abnormality. IMPRESSION: 1. No acute process. Electronically signed by: Dayne Hassell MD 07/10/2024 04:48 PM EDT RP Workstation: [id number]     Procedures   Medications Ordered in the ED  albuterol  (VENTOLIN  HFA) 108 (90 Base) MCG/ACT inhaler 1 puff (has no administration in time range)  azithromycin  (ZITHROMAX ) tablet 500 mg (has no administration in time range)  predniSONE  (DELTASONE ) tablet  40 mg (has no administration in time range)                                    Medical Decision Making Amount and/or Complexity of Data Reviewed Labs: ordered. Radiology: ordered.   This patient presents to the ED for concern of shortness of breath, this involves an extensive number of treatment options, and is a complaint that carries with it a high risk of complications and morbidity.  The differential diagnosis includes Anxiety, Anaphylaxis/Angioedema, Aspirated FB, Arrhythmia, CHF, Asthma, COPD, PNA, COVID/Flu/RSV, STEMI, Tamponade, TPNX, Sepsis   Co morbidities that complicate the patient evaluation  HTN   Additional history obtained:  Dr. Vernon PCP   Problem List / ED Course / Critical interventions / Medication management  Patient presents to ED concerned for productive cough and DOE progressing over the past 1 week. Family member at bedside stating that everyone in the family is undergoing the same type of illness at home. Patient ambulated on pulse ox with oxygen saturation staying near 98% on RA. There is LLL rhonchi during lung auscultation. Rest of physical exam reassuring. Patient afebrile with stable vitals. I Ordered, and personally interpreted labs.  CBC without leukocytosis or anemia.  BMP reassuring.  Respiratory panel negative. The patient was  maintained on a cardiac monitor.  I personally viewed and interpreted the cardiac monitored which showed an underlying rhythm of: sinus rhythm. I ordered imaging studies including chest xray to assess for process contributing to patient's symptoms. I independently visualized and interpreted imaging which showed no acute process. I agree with the radiologist interpretation. Shared all results with patient.  Answered all questions.  Will start patient on azithromycin  and prednisone .  Do suspect that patient's SOB is also related to tobacco use and probably developing COPD. Again, patient did ambulate well in ED. I will also prescribe albuterol  inhaler and have patient follow-up with PCP for this concern.  Patient verbally endorses understanding of plan. Spent at least 2 minutes talking to patient about the risk of tobacco use.  Offered to prescribe patient nicotine  patches which he accepted. Staffed with Dr. Ruthe. I have reviewed the patients home medicines and have made adjustments as needed The patient has been appropriately medically screened and/or stabilized in the ED. I have low suspicion for any other emergent medical condition which would require further screening, evaluation or treatment in the ED or require inpatient management. At time of discharge the patient is hemodynamically stable and in no acute distress. I have discussed work-up results and diagnosis with patient and answered all questions. Patient is agreeable with discharge plan. We discussed strict return precautions for returning to the emergency department and they verbalized understanding.      Social Determinants of Health:  none       Final diagnoses:  Pneumonia of left lower lobe due to infectious organism    ED Discharge Orders          Ordered    azithromycin  (ZITHROMAX ) 250 MG tablet  Daily        07/10/24 1924    predniSONE  (DELTASONE ) 20 MG tablet  Daily        07/10/24 1924    nicotine  (NICODERM CQ  -  DOSED IN MG/24 HOURS) 14 mg/24hr patch  Daily        07/10/24 1932               Hoy,  Nidia FALCON, PA-C 07/10/24 1938    Ruthe Cornet, DO 07/10/24 1954

## 2024-07-10 NOTE — ED Notes (Signed)
 Dc instructions given, pt verbalzied understanding. Out of ED with all belongings, paperwork, not in visible distress.

## 2024-07-10 NOTE — Discharge Instructions (Addendum)
 As discussed, you will need to follow-up with your primary care provider in the next 48 to 72 hours.  Seek emergency care if experiencing any new or worsening symptoms.

## 2024-07-10 NOTE — ED Triage Notes (Signed)
 Pt arrives to ED with complaints of shortness of breath and coughing. States that he is coughing up some green-ish mucous. Denies fever or chills.

## 2024-07-21 ENCOUNTER — Emergency Department (HOSPITAL_BASED_OUTPATIENT_CLINIC_OR_DEPARTMENT_OTHER)
Admission: EM | Admit: 2024-07-21 | Discharge: 2024-07-21 | Disposition: A | Discharge status: Home / Self Care | Attending: Emergency Medicine | Admitting: Emergency Medicine

## 2024-07-21 ENCOUNTER — Emergency Department (HOSPITAL_BASED_OUTPATIENT_CLINIC_OR_DEPARTMENT_OTHER)

## 2024-07-21 ENCOUNTER — Other Ambulatory Visit: Payer: Self-pay

## 2024-07-21 ENCOUNTER — Encounter (HOSPITAL_BASED_OUTPATIENT_CLINIC_OR_DEPARTMENT_OTHER): Payer: Self-pay | Admitting: *Deleted

## 2024-07-21 DIAGNOSIS — R001 Bradycardia, unspecified: Secondary | ICD-10-CM | POA: Diagnosis not present

## 2024-07-21 DIAGNOSIS — R519 Headache, unspecified: Secondary | ICD-10-CM | POA: Insufficient documentation

## 2024-07-21 DIAGNOSIS — D72829 Elevated white blood cell count, unspecified: Secondary | ICD-10-CM | POA: Insufficient documentation

## 2024-07-21 HISTORY — DX: Unspecified hemorrhoids: K64.9

## 2024-07-21 LAB — CBC WITH DIFFERENTIAL/PLATELET
Abs Immature Granulocytes: 0.07 K/uL (ref 0.00–0.07)
Basophils Absolute: 0.1 K/uL (ref 0.0–0.1)
Basophils Relative: 1 %
Eosinophils Absolute: 0.2 K/uL (ref 0.0–0.5)
Eosinophils Relative: 1 %
HCT: 43.4 % (ref 39.0–52.0)
Hemoglobin: 14.4 g/dL (ref 13.0–17.0)
Immature Granulocytes: 1 %
Lymphocytes Relative: 22 %
Lymphs Abs: 2.4 K/uL (ref 0.7–4.0)
MCH: 29 pg (ref 26.0–34.0)
MCHC: 33.2 g/dL (ref 30.0–36.0)
MCV: 87.3 fL (ref 80.0–100.0)
Monocytes Absolute: 0.8 K/uL (ref 0.1–1.0)
Monocytes Relative: 8 %
Neutro Abs: 7.3 K/uL (ref 1.7–7.7)
Neutrophils Relative %: 67 %
Platelets: 300 K/uL (ref 150–400)
RBC: 4.97 MIL/uL (ref 4.22–5.81)
RDW: 14 % (ref 11.5–15.5)
WBC: 10.8 K/uL — ABNORMAL HIGH (ref 4.0–10.5)
nRBC: 0 % (ref 0.0–0.2)

## 2024-07-21 LAB — COMPREHENSIVE METABOLIC PANEL WITH GFR
ALT: 27 U/L (ref 0–44)
AST: 22 U/L (ref 15–41)
Albumin: 4.1 g/dL (ref 3.5–5.0)
Alkaline Phosphatase: 51 U/L (ref 38–126)
Anion gap: 11 (ref 5–15)
BUN: 12 mg/dL (ref 8–23)
CO2: 26 mmol/L (ref 22–32)
Calcium: 9.7 mg/dL (ref 8.9–10.3)
Chloride: 104 mmol/L (ref 98–111)
Creatinine, Ser: 1.28 mg/dL — ABNORMAL HIGH (ref 0.61–1.24)
GFR, Estimated: >60 mL/min (ref >60)
Glucose, Bld: 81 mg/dL (ref 70–99)
Potassium: 3.9 mmol/L (ref 3.5–5.1)
Sodium: 141 mmol/L (ref 135–145)
Total Bilirubin: 0.5 mg/dL (ref 0.0–1.2)
Total Protein: 7.2 g/dL (ref 6.5–8.1)

## 2024-07-21 LAB — SEDIMENTATION RATE: Sed Rate: 10 mm/h (ref 0–16)

## 2024-07-21 MED ORDER — DIPHENHYDRAMINE HCL 50 MG/ML IJ SOLN
25.0000 mg | Freq: Once | INTRAMUSCULAR | Status: AC
  Administered 2024-07-21: 25 mg via INTRAVENOUS
  Filled 2024-07-21: qty 1

## 2024-07-21 MED ORDER — METOCLOPRAMIDE HCL 5 MG/ML IJ SOLN
10.0000 mg | Freq: Once | INTRAMUSCULAR | Status: AC
Start: 2024-07-21 — End: 2024-07-21
  Administered 2024-07-21: 10 mg via INTRAVENOUS
  Filled 2024-07-21: qty 2

## 2024-07-21 MED ORDER — LACTATED RINGERS IV BOLUS
1000.0000 mL | Freq: Once | INTRAVENOUS | Status: AC
Start: 2024-07-21 — End: 2024-07-21
  Administered 2024-07-21: 1000 mL via INTRAVENOUS

## 2024-07-21 NOTE — ED Provider Notes (Signed)
 [place] [facility] [place] Provider Note   CSN: [id number] Arrival date & time: 07/21/24  9161     Patient presents with: Headache   Scott Tate is a 68 y.o. male.   HPI 68 year old male presents with headache.  For started around 2 AM after he had gotten up and gone to the bathroom.  Was mild at first, about a 4 out of 10, and then has gradually worsened throughout the morning.  It is now 10/10.  It is left-sided near his temple.  This is a headache he has had multiple times before though not super often.  Feels similar to those episodes.  He has nausea without vomiting.  He denies any blurry vision or photophobia (despite the triage note indicating some photophobia).  No neck pain or stiffness, fever, or focal weakness/numbness.  He denies any current dizziness or lightheadedness.  His heart rate is noted to be low though he denies knowing if he is ever had this before.  Prior to Admission medications   Medication Sig Start Date End Date Taking? Authorizing Provider  albuterol  (VENTOLIN  HFA) 108 (90 Base) MCG/ACT inhaler Inhale 2 puffs into the lungs every 6 (six) hours as needed for wheezing or shortness of breath.    [provider]  benzonatate  (TESSALON ) 100 MG capsule Take 1 capsule (100 mg total) by mouth every 8 (eight) hours. 07/16/23   Kandis Perkins, DO  cephALEXin  (KEFLEX ) 500 MG capsule Take 1 capsule (500 mg total) by mouth 4 (four) times daily. 04/16/24   Haze Lonni PARAS, MD  cyclobenzaprine  (FLEXERIL ) 10 MG tablet Take 1 tablet (10 mg total) by mouth 3 (three) times daily as needed for muscle spasms. 06/13/22   Geroldine Berg, MD  docusate sodium  (COLACE) 250 MG capsule Take 1 capsule (250 mg total) by mouth daily. 02/23/21   Charlyn Sora, MD  famotidine  (PEPCID ) 20 MG tablet Take 1 tablet (20 mg total) by mouth at bedtime. 09/22/23   Raspet, Erin K, PA-C  guaiFENesin  (ROBITUSSIN) 100 MG/5ML liquid Take 5 mLs by mouth every 6  (six) hours as needed for cough or to loosen phlegm. 07/16/23   Kandis Perkins, DO  lidocaine  (XYLOCAINE ) 2 % jelly Apply 1 application topically 3 (three) times daily as needed. Max use for 7 days 02/23/21   Charlyn Sora, MD  Lidocaine , Anorectal, 5 % CREA Apply 1 application topically daily as needed (pain).    [provider]  losartan (COZAAR) 25 MG tablet Take 25 mg by mouth daily. 09/27/20   [provider]  nicotine  (NICODERM CQ  - DOSED IN MG/24 HOURS) 14 mg/24hr patch Place 1 patch (14 mg total) onto the skin daily. 07/10/24   Hoy Nidia FALCON, PA-C  omeprazole  (PRILOSEC) 20 MG capsule Take 20 mg by mouth daily. 02/08/21   [provider]  Pramox-PE-Glycerin-Petrolatum (PREPARATION H) 1-0.25-14.4-15 % CREA Apply 1 application topically 3 (three) times daily.    [provider]  predniSONE  (DELTASONE ) 20 MG tablet Take 2 tablets (40 mg total) by mouth daily. 07/10/24   Hoy Nidia FALCON, PA-C  sucralfate  (CARAFATE ) 1 g tablet Take 1 tablet (1 g total) by mouth 4 (four) times daily -  with meals and at bedtime. 09/22/23   Raspet, Erin K, PA-C  tamsulosin  (FLOMAX ) 0.4 MG CAPS capsule Take 0.4 mg by mouth daily. 07/14/20   [provider]  traMADol  (ULTRAM ) 50 MG tablet Take 50 mg by mouth every 6 (six) hours as needed for  severe pain (pain). 10/17/20   [provider]    Allergies: Patient has no known allergies.    Review of Systems  Constitutional:  Negative for fever.  Eyes:  Negative for photophobia and visual disturbance.  Respiratory:  Negative for shortness of breath.   Cardiovascular:  Negative for chest pain.  Gastrointestinal:  Positive for nausea. Negative for vomiting.  Musculoskeletal:  Negative for neck pain.  Neurological:  Positive for headaches. Negative for dizziness, weakness, light-headedness and numbness.    Updated Vital Signs BP 134/67   Pulse 61   Temp 97.8 F (36.6 C) (Oral)   Resp (!) 22   SpO2 97%    Physical Exam Vitals and nursing note reviewed.  Constitutional:      Appearance: He is well-developed.  HENT:     Head: Normocephalic and atraumatic.     Comments: No tenderness over left temple Eyes:     Extraocular Movements: Extraocular movements intact.     Pupils: Pupils are equal, round, and reactive to light.  Cardiovascular:     Rate and Rhythm: Regular rhythm. Bradycardia present.     Heart sounds: Normal heart sounds.  Pulmonary:     Effort: Pulmonary effort is normal.     Breath sounds: Normal breath sounds.  Abdominal:     Palpations: Abdomen is soft.     Tenderness: There is no abdominal tenderness.  Musculoskeletal:     Cervical back: No rigidity.  Skin:    General: Skin is warm and dry.  Neurological:     Mental Status: He is alert.     Comments: CN 3-12 grossly intact. 5/5 strength in all 4 extremities. Grossly normal sensation. Normal finger to nose.      (all labs ordered are listed, but only abnormal results are displayed) Labs Reviewed  COMPREHENSIVE METABOLIC PANEL WITH GFR - Abnormal; Notable for the following components:      Result Value   Creatinine, Ser 1.28 (*)    All other components within normal limits  CBC WITH DIFFERENTIAL/PLATELET - Abnormal; Notable for the following components:   WBC 10.8 (*)    All other components within normal limits  SEDIMENTATION RATE    EKG: EKG Interpretation Date/Time:  Wednesday July 21 2024 09:02:11 EDT Ventricular Rate:  39 PR Interval:  135 QRS Duration:  97 QT Interval:  472 QTC Calculation: 381 R Axis:   90  Text Interpretation: Sinus bradycardia Borderline right axis deviation no acute ST/T changes Confirmed by Freddi Hamilton [phone]) on 07/21/2024 9:04:27 AM  Radiology: CT Head Wo Contrast Result Date: 07/21/2024 EXAM: CT HEAD WITHOUT CONTRAST 07/21/2024 09:37:00 AM TECHNIQUE: CT of the head was performed without the administration of intravenous contrast. Automated exposure control,  iterative reconstruction, and/or weight based adjustment of the mA/kV was utilized to reduce the radiation dose to as low as reasonably achievable. COMPARISON: CT head without contrast 11/29/2023. CLINICAL HISTORY: Headache, new onset (Age >= 51y). Woke at 2am w/severe ha. FINDINGS: BRAIN AND VENTRICLES: No acute hemorrhage. No evidence of acute infarct. No hydrocephalus. No extra-axial collection. No mass effect or midline shift. Focal chronic calcification in the anterior right corona radiata is again noted. ORBITS: No acute abnormality. SINUSES: No acute abnormality. SOFT TISSUES AND SKULL: No acute soft tissue abnormality. No skull fracture. IMPRESSION: 1. No acute intracranial abnormality related to the new onset headache. 2. Focal chronic calcification in the anterior right corona radiata, stable compared to prior study. Electronically signed by: Lonni Necessary MD 07/21/2024 09:52 AM  EDT RP Workstation: [id number]     Procedures   Medications Ordered in the ED  lactated ringers  bolus 1,000 mL (0 mLs Intravenous Stopped 07/21/24 1103)  metoCLOPramide  (REGLAN ) injection 10 mg (10 mg Intravenous Given 07/21/24 0912)  diphenhydrAMINE  (BENADRYL ) injection 25 mg (25 mg Intravenous Given 07/21/24 0912)                                    Medical Decision Making Amount and/or Complexity of Data Reviewed External Data Reviewed: notes. Labs: ordered.    Details: Slight leukocytosis.  Normal ESR Radiology: ordered and independent interpretation performed.    Details: No head bleed ECG/medicine tests: ordered and independent interpretation performed.    Details: Sinus bradycardia  Risk Prescription drug management.   Patient's headache seems to be a recurrent issue for him.  He does have pain over the temple and given his age an ESR was obtained but is normal.  He does not have any vision changes.  Low suspicion for temporal arteritis.  CT head was obtained given his degree of hypertension,  though this was probably more pain related as his blood pressure has returned to essentially normal after treatment of his headache.  Low suspicion for occult subarachnoid hemorrhage given recurrence of symptoms and no thunderclap component.  Doubt infection.  No strokelike symptoms.  He is noted to have significant sinus bradycardia, primarily in the 40s but occasionally dipping into the high 30s.  Seems to be asymptomatic.  Up talk to him about this multiple times, no dizziness, lightheadedness, near-syncope or syncope.  No chest symptoms.  Seems to be asymptomatic, has had low heart rates in the chart in the past.  He is not on any obvious AV nodal blockers.  Will have him follow-up with cardiology as an outpatient but there is no obvious AV block on ECG and he does not appear to have symptoms so I do not think he needs an emergent cardiology consultation.  Otherwise, will discharge home with return precautions.     Final diagnoses:  Left-sided headache  Sinus bradycardia    ED Discharge Orders          Ordered    Ambulatory referral to Cardiology       Comments: If you have not heard from the Cardiology office within the next 72 hours please call [phone].   07/21/24 1104               Freddi Hamilton, MD 07/21/24 1118

## 2024-07-21 NOTE — ED Triage Notes (Signed)
 Pt is here for evaluation of headache which he describes as both throbbing and sharp and is in the top of his head.  Pt states that this is associated with sensitivity to light and sound as well as nausea.  No focal weakness with this. Pt woke up with this severe headache at 2am.

## 2024-07-21 NOTE — Discharge Instructions (Signed)
 Fortunately your testing here is reassuring today.  You may use Tylenol  and ibuprofen  to help with headache should it come back.  Your heart rate is low today, you will need to follow-up with a cardiologist.  However if you develop symptoms of dizziness or lightheadedness, passing out, chest pain, or any other new/concerning symptoms you should return to the ER.  Similarly, if you develop new or worsening headache or other new/concerning symptoms then return to the ER.

## 2024-08-09 ENCOUNTER — Other Ambulatory Visit: Payer: Self-pay | Admitting: Internal Medicine

## 2024-08-09 DIAGNOSIS — N1831 Chronic kidney disease, stage 3a: Secondary | ICD-10-CM | POA: Diagnosis not present

## 2024-08-09 DIAGNOSIS — Z23 Encounter for immunization: Secondary | ICD-10-CM | POA: Diagnosis not present

## 2024-08-09 DIAGNOSIS — Z125 Encounter for screening for malignant neoplasm of prostate: Secondary | ICD-10-CM | POA: Diagnosis not present

## 2024-08-09 DIAGNOSIS — R519 Headache, unspecified: Secondary | ICD-10-CM | POA: Diagnosis not present

## 2024-08-09 DIAGNOSIS — N400 Enlarged prostate without lower urinary tract symptoms: Secondary | ICD-10-CM | POA: Diagnosis not present

## 2024-08-09 DIAGNOSIS — Z905 Acquired absence of kidney: Secondary | ICD-10-CM | POA: Diagnosis not present

## 2024-08-09 DIAGNOSIS — E7800 Pure hypercholesterolemia, unspecified: Secondary | ICD-10-CM | POA: Diagnosis not present

## 2024-08-09 DIAGNOSIS — M4602 Spinal enthesopathy, cervical region: Secondary | ICD-10-CM | POA: Diagnosis not present

## 2024-08-09 DIAGNOSIS — I10 Essential (primary) hypertension: Secondary | ICD-10-CM | POA: Diagnosis not present

## 2024-08-09 DIAGNOSIS — Z1331 Encounter for screening for depression: Secondary | ICD-10-CM | POA: Diagnosis not present

## 2024-08-09 DIAGNOSIS — K644 Residual hemorrhoidal skin tags: Secondary | ICD-10-CM | POA: Diagnosis not present

## 2024-08-09 DIAGNOSIS — Z0000 Encounter for general adult medical examination without abnormal findings: Secondary | ICD-10-CM | POA: Diagnosis not present

## 2024-08-15 ENCOUNTER — Other Ambulatory Visit

## 2024-10-25 ENCOUNTER — Emergency Department (HOSPITAL_COMMUNITY)

## 2024-10-25 ENCOUNTER — Emergency Department (HOSPITAL_COMMUNITY)
Admission: EM | Admit: 2024-10-25 | Discharge: 2024-10-25 | Disposition: A | Discharge status: Home / Self Care | Attending: Emergency Medicine | Admitting: Emergency Medicine

## 2024-10-25 ENCOUNTER — Other Ambulatory Visit: Payer: Self-pay

## 2024-10-25 DIAGNOSIS — R001 Bradycardia, unspecified: Secondary | ICD-10-CM | POA: Insufficient documentation

## 2024-10-25 DIAGNOSIS — Z79899 Other long term (current) drug therapy: Secondary | ICD-10-CM | POA: Insufficient documentation

## 2024-10-25 DIAGNOSIS — D72829 Elevated white blood cell count, unspecified: Secondary | ICD-10-CM | POA: Insufficient documentation

## 2024-10-25 DIAGNOSIS — R200 Anesthesia of skin: Secondary | ICD-10-CM | POA: Diagnosis not present

## 2024-10-25 DIAGNOSIS — R519 Headache, unspecified: Secondary | ICD-10-CM | POA: Insufficient documentation

## 2024-10-25 DIAGNOSIS — Z8673 Personal history of transient ischemic attack (TIA), and cerebral infarction without residual deficits: Secondary | ICD-10-CM | POA: Insufficient documentation

## 2024-10-25 DIAGNOSIS — I10 Essential (primary) hypertension: Secondary | ICD-10-CM | POA: Insufficient documentation

## 2024-10-25 LAB — DIFFERENTIAL
Abs Immature Granulocytes: 0.04 K/uL (ref 0.00–0.07)
Basophils Absolute: 0.1 K/uL (ref 0.0–0.1)
Basophils Relative: 0 %
Eosinophils Absolute: 0 K/uL (ref 0.0–0.5)
Eosinophils Relative: 0 %
Immature Granulocytes: 0 %
Lymphocytes Relative: 10 %
Lymphs Abs: 1.2 K/uL (ref 0.7–4.0)
Monocytes Absolute: 0.7 K/uL (ref 0.1–1.0)
Monocytes Relative: 6 %
Neutro Abs: 10.3 K/uL — ABNORMAL HIGH (ref 1.7–7.7)
Neutrophils Relative %: 84 %

## 2024-10-25 LAB — COMPREHENSIVE METABOLIC PANEL WITH GFR
ALT: 21 U/L (ref 0–44)
AST: 28 U/L (ref 15–41)
Albumin: 4.2 g/dL (ref 3.5–5.0)
Alkaline Phosphatase: 65 U/L (ref 38–126)
Anion gap: 10 (ref 5–15)
BUN: 13 mg/dL (ref 8–23)
CO2: 24 mmol/L (ref 22–32)
Calcium: 9.4 mg/dL (ref 8.9–10.3)
Chloride: 106 mmol/L (ref 98–111)
Creatinine, Ser: 1.21 mg/dL (ref 0.61–1.24)
GFR, Estimated: >60 mL/min
Glucose, Bld: 93 mg/dL (ref 70–99)
Potassium: 4.1 mmol/L (ref 3.5–5.1)
Sodium: 140 mmol/L (ref 135–145)
Total Bilirubin: 0.7 mg/dL (ref 0.0–1.2)
Total Protein: 7.3 g/dL (ref 6.5–8.1)

## 2024-10-25 LAB — CBC
HCT: 40.7 % (ref 39.0–52.0)
Hemoglobin: 13.3 g/dL (ref 13.0–17.0)
MCH: 29.4 pg (ref 26.0–34.0)
MCHC: 32.7 g/dL (ref 30.0–36.0)
MCV: 89.8 fL (ref 80.0–100.0)
Platelets: 300 K/uL (ref 150–400)
RBC: 4.53 MIL/uL (ref 4.22–5.81)
RDW: 13.1 % (ref 11.5–15.5)
WBC: 12.3 K/uL — ABNORMAL HIGH (ref 4.0–10.5)
nRBC: 0 % (ref 0.0–0.2)

## 2024-10-25 LAB — ETHANOL: Alcohol, Ethyl (B): <15 mg/dL

## 2024-10-25 LAB — I-STAT CHEM 8, ED
BUN: 15 mg/dL (ref 8–23)
Calcium, Ion: 1.15 mmol/L (ref 1.15–1.40)
Chloride: 105 mmol/L (ref 98–111)
Creatinine, Ser: 1.3 mg/dL — ABNORMAL HIGH (ref 0.61–1.24)
Glucose, Bld: 94 mg/dL (ref 70–99)
HCT: 41 % (ref 39.0–52.0)
Hemoglobin: 13.9 g/dL (ref 13.0–17.0)
Potassium: 4 mmol/L (ref 3.5–5.1)
Sodium: 141 mmol/L (ref 135–145)
TCO2: 23 mmol/L (ref 22–32)

## 2024-10-25 LAB — CBG MONITORING, ED: Glucose-Capillary: 87 mg/dL (ref 70–99)

## 2024-10-25 LAB — APTT: aPTT: 32 s (ref 24–36)

## 2024-10-25 LAB — PROTIME-INR
INR: 1 (ref 0.8–1.2)
Prothrombin Time: 14.2 s (ref 11.4–15.2)

## 2024-10-25 LAB — C-REACTIVE PROTEIN: CRP: <0.5 mg/dL

## 2024-10-25 LAB — SEDIMENTATION RATE: Sed Rate: 7 mm/h (ref 0–16)

## 2024-10-25 MED ORDER — ACETAMINOPHEN 500 MG PO TABS
1000.0000 mg | ORAL_TABLET | Freq: Once | ORAL | Status: AC
  Administered 2024-10-25: 1000 mg via ORAL
  Filled 2024-10-25: qty 2

## 2024-10-25 MED ORDER — DIPHENHYDRAMINE HCL 50 MG/ML IJ SOLN
12.5000 mg | Freq: Once | INTRAMUSCULAR | Status: AC
  Administered 2024-10-25: 12.5 mg via INTRAVENOUS
  Filled 2024-10-25: qty 1

## 2024-10-25 MED ORDER — SODIUM CHLORIDE 0.9% FLUSH
3.0000 mL | Freq: Once | INTRAVENOUS | Status: AC
  Administered 2024-10-25: 3 mL via INTRAVENOUS

## 2024-10-25 MED ORDER — IOHEXOL 350 MG/ML SOLN
75.0000 mL | Freq: Once | INTRAVENOUS | Status: AC | PRN
  Administered 2024-10-25: 75 mL via INTRAVENOUS

## 2024-10-25 MED ORDER — PROCHLORPERAZINE EDISYLATE 10 MG/2ML IJ SOLN
10.0000 mg | Freq: Once | INTRAMUSCULAR | Status: AC
  Administered 2024-10-25: 10 mg via INTRAVENOUS
  Filled 2024-10-25: qty 2

## 2024-10-25 NOTE — Progress Notes (Signed)
 Reason for Consult:Elsie Body Referring Physician: Code stroke  DWON Scott Tate is an 68 y.o. male.  HPI: Mr. Struble is a 68 year old Caucasian male with past medical history of chronic headaches, hypertension and right leg numbness.  He was brought in by EMS as a code stroke today.  Patient was last known well at 2 AM this morning when he went to bed.  When he woke up in the morning he noticed that he was having left sided headache.  He described a brief episode of right-sided weakness and numbness at 2 PM today and headache became severe.  Patient upon arrival reported headache with benign significant right-sided weakness.  He told me that his right leg numbness has been going on for 3 to 4 weeks and he did not seek medical advice as there was not have insurance.  He does admit to be under significant stress for is not willing to tell me what is going on.  He has had chronic headaches for a long time but has never seen a neurologist.  On neurological exam patient was awake alert and gave good history.  Neurological exam was nonfocal except for mild subjective right leg numbness which she says is chronic. Last known well 2 AM today. 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 He has never used smokeless tobacco. He reports current alcohol use of about 8.0 standard drinks of alcohol per week. He reports that he does not currently use drugs after having used  the following drugs: Marijuana and Cocaine.  Allergies: Allergies[1]  Medications: I have reviewed the patient's current medications.  Results for orders placed or performed during the hospital encounter of 10/25/24 (from the past 48 hours)  CBG monitoring, ED     Status: None   Collection Time: 10/25/24  3:12 PM  Result Value Ref Range   Glucose-Capillary 87 70 - 99 mg/dL    Comment: Glucose reference range applies only to samples taken after fasting for at least 8 hours.  I-stat chem 8, ED     Status: Abnormal   Collection Time: 10/25/24  3:19 PM  Result Value Ref Range   Sodium 141 135 - 145 mmol/L   Potassium 4.0 3.5 - 5.1 mmol/L   Chloride 105 98 - 111 mmol/L   BUN 15 8 - 23 mg/dL   Creatinine, Ser 8.69 (H) 0.61 - 1.24 mg/dL   Glucose, Bld 94 70 - 99 mg/dL    Comment: Glucose reference range applies only to samples taken after fasting for at least 8 hours.   Calcium, Ion 1.15 1.15 - 1.40 mmol/L   TCO2 23 22 - 32 mmol/L   Hemoglobin 13.9 13.0 - 17.0 g/dL   HCT 58.9 60.9 - 47.9 %  Protime-INR     Status: None   Collection Time: 10/25/24  3:20 PM  Result Value Ref  Range   Prothrombin Time 14.2 11.4 - 15.2 seconds   INR 1.0 0.8 - 1.2    Comment: (NOTE) INR goal varies based on device and disease states. Performed at [facility], [address], [place], [place] [address]   APTT     Status: None   Collection Time: 10/25/24  3:20 PM  Result Value Ref Range   aPTT 32 24 - 36 seconds    Comment: Performed at [facility], [address] [place], [place] [address]  CBC     Status: Abnormal   Collection Time: 10/25/24  3:20 PM  Result Value Ref Range   WBC 12.3 (H) 4.0 - 10.5 K/uL   RBC 4.53 4.22 - 5.81 MIL/uL   Hemoglobin 13.3 13.0 - 17.0 g/dL   HCT 59.2 60.9 - 47.9 %   MCV 89.8 80.0 - 100.0 fL   MCH 29.4 26.0 - 34.0 pg   MCHC 32.7 30.0 - 36.0 g/dL   RDW 86.8 88.4 - 84.4 %   Platelets 300 150 - 400 K/uL   nRBC 0.0 0.0 - 0.2 %    Comment: Performed at  [facility], [address], [place], [place] [address]  Differential     Status: Abnormal   Collection Time: 10/25/24  3:20 PM  Result Value Ref Range   Neutrophils Relative % 84 %   Neutro Abs 10.3 (H) 1.7 - 7.7 K/uL   Lymphocytes Relative 10 %   Lymphs Abs 1.2 0.7 - 4.0 K/uL   Monocytes Relative 6 %   Monocytes Absolute 0.7 0.1 - 1.0 K/uL   Eosinophils Relative 0 %   Eosinophils Absolute 0.0 0.0 - 0.5 K/uL   Basophils Relative 0 %   Basophils Absolute 0.1 0.0 - 0.1 K/uL   Immature Granulocytes 0 %   Abs Immature Granulocytes 0.04 0.00 - 0.07 K/uL    Comment: Performed at [facility], [address] [place], [place] [address]  Comprehensive metabolic panel     Status: None   Collection Time: 10/25/24  3:20 PM  Result Value Ref Range   Sodium 140 135 - 145 mmol/L   Potassium 4.1 3.5 - 5.1 mmol/L   Chloride 106 98 - 111 mmol/L   CO2 24 22 - 32 mmol/L   Glucose, Bld 93 70 - 99 mg/dL    Comment: Glucose reference range applies only to samples taken after fasting for at least 8 hours.   BUN 13 8 - 23 mg/dL   Creatinine, Ser 8.78 0.61 - 1.24 mg/dL   Calcium 9.4 8.9 - 89.6 mg/dL   Total Protein 7.3 6.5 - 8.1 g/dL   Albumin 4.2 3.5 - 5.0 g/dL   AST 28 15 - 41 U/L    Comment: HEMOLYSIS AT THIS LEVEL MAY AFFECT RESULT   ALT 21 0 - 44 U/L   Alkaline Phosphatase 65 38 - 126 U/L   Total Bilirubin 0.7 0.0 - 1.2 mg/dL   GFR, Estimated >39 >39 mL/min    Comment: (NOTE) Calculated using the CKD-EPI Creatinine Equation (2021)    Anion gap 10 5 - 15    Comment: Performed at [facility], [address], [place], [place] [address]  Ethanol     Status: None   Collection Time: 10/25/24  3:20 PM  Result Value Ref Range   Alcohol, Ethyl (B) <15 <15 mg/dL    Comment: (NOTE) For medical purposes only. Performed at [facility]  [facility], [address] [place], [place] [address]     CT ANGIO HEAD NECK W WO CM (CODE STROKE) Result Date: 10/25/2024 EXAM: CTA Head and  Neck with Intravenous Contrast. CLINICAL HISTORY: Neuro deficit, acute, stroke suspected. TECHNIQUE: Axial CTA images of the head and neck performed with intravenous contrast. MIP reconstructed images were created and reviewed. Axial computed tomography images of the head/brain performed without intravenous contrast. Note: Per PQRS, the description of internal carotid artery percent stenosis, including 0 percent or normal exam, is based on [place] American Symptomatic Carotid Endarterectomy Trial (NASCET) criteria. Dose reduction technique was used including one or more of the following: automated exposure control, adjustment of mA and kV according to patient size, and/or iterative reconstruction. CONTRAST: Without and with; 75 mL (iohexol  (OMNIPAQUE ) 350 MG/ML injection 75 mL IOHEXOL  350 MG/ML SOLN). COMPARISON: None provided. FINDINGS: CTA NECK: The aortic arch is not visualized. There is possible common origin of the brachiocephalic and left common carotid arteries. COMMON CAROTID ARTERIES: No significant stenosis. No dissection or occlusion. INTERNAL CAROTID ARTERIES: No stenosis by NASCET criteria. No dissection or occlusion. VERTEBRAL ARTERIES: No significant stenosis. No dissection or occlusion. CTA HEAD: ANTERIOR CEREBRAL ARTERIES: No significant stenosis. No occlusion. No aneurysm. MIDDLE CEREBRAL ARTERIES: No significant stenosis. No occlusion. No aneurysm. POSTERIOR CEREBRAL ARTERIES: No significant stenosis. No occlusion. No aneurysm. 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 No significant stenosis. No occlusion. No aneurysm. OTHER: SOFT TISSUES: No acute finding. No masses or lymphadenopathy. BONES: Degenerative changes of the visualized spine. Degenerative changes of the left temporomandibular joint. No acute osseous abnormality. LUNGS: Centrilobular emphysema in the lung apices. IMPRESSION: 1. No large vessel occlusion. 2. No evidence of significant stenosis, aneurysmal  dilatation, or dissection involving the arteries of the head and neck. 3. Pulmonary emphysema is an independent risk factor for lung cancer. Recommend consideration for evaluation for a low-dose CT lung cancer screening program. Electronically signed by: Donnice Mania MD 10/25/2024 03:43 PM EST RP Workstation: [id number]   CT HEAD CODE STROKE WO CONTRAST Result Date: 10/25/2024 EXAM: CT HEAD WITHOUT CONTRAST 10/25/2024 03:17:44 PM TECHNIQUE: CT of the head was performed without the administration of intravenous contrast. Automated exposure control, iterative reconstruction, and/or weight based adjustment of the mA/kV was utilized to reduce the radiation dose to as low as reasonably achievable. COMPARISON: CT head 07/21/2024. CLINICAL HISTORY: Neuro deficit, acute, stroke suspected. FINDINGS: BRAIN AND VENTRICLES: No acute hemorrhage. No evidence of acute infarct. No hydrocephalus. No extra-axial collection. No mass effect or midline shift. Similar bilateral basal ganglia mineralization. Additional focal chronic calcification in the anterior right corona radiata. [place] Stroke Program Early CT (ASPECT) score: Ganglionic (caudate, IC, lentiform nucleus, insula, M1-M3): 7. Supraganglionic (M4-M6): 3. Total: 10. ORBITS: No acute abnormality. SINUSES: No acute abnormality. SOFT TISSUES AND SKULL: No acute soft tissue abnormality. No skull fracture. IMPRESSION: 1. No acute intracranial abnormality. 2. ASPECT score 10. 3. Findings messaged to Dr. Rosemarie via the [facility] messaging system at 3:23 PM on 10/25/24. Electronically signed by: Donnice Mania MD 10/25/2024 03:24 PM EST RP Workstation: [id number]   CT-scan of the brain MRI examination of the brain.   CT-scan of the brain  Ultrasound   ROS Blood pressure 116/67, pulse (!) 46, temperature 98.2 F (36.8 C), temperature source Oral, resp. rate 17, height 5' 5 (1.651 m), weight 69.6 kg, SpO2 100%. Physical Exam 1a Level of Conscious.: 0 1b LOC Questions:  0 1c LOC Commands: 0 2 Best Gaze: 0 3 Visual: 0  4 Facial Palsy: 0 5a Motor Arm - left: 0 5b Motor Arm - Right: 0 6a Motor Leg - Left: 0 6b Motor Leg - Right: 0 7 Limb Ataxia: 0 8 Sensory: 1-chronic right lower extremity numbness 9 Best Language: 0 10 Dysarthria: 0 11 Extinct. and Inatten.: 0 TOTAL: 1 Assessment/Plan: 68 year old Caucasian male presents with worsening of chronic left hemicranial headaches and some right leg numbness which also appears to be more acute. Neurological presentation is not compatible with acute stroke.  CT head shows no acute abnormality.  CT angiogram shows no large vessel stenosis or occlusion. Recommend treatment of headache with migraine cocktail.  If symptoms improve may consider discharge home.  Outpatient evaluation with neurology for his headaches and right leg numbness.  No further acute stroke related evaluation is necessary at this point.  Kindly call for questions.   Discussed with ER MD.     I personally spent a total of 55 minutes in the care of the patient today including getting/reviewing separately obtained history, performing a medically appropriate exam/evaluation, counseling and educating, placing orders, referring and communicating with other health care professionals, documenting clinical information in the EHR, independently interpreting results, and coordinating care.        Eather Popp 10/25/2024, 5:29 PM    Note: This document was prepared with digital dictation and possible smart phrase technology. Any transcriptional errors that result from this process are unintentional.     [1] No Known Allergies

## 2024-10-25 NOTE — ED Provider Notes (Signed)
 " [place] EMERGENCY DEPARTMENT AT [facility] Provider Note  CSN: [id number] Arrival date & time: 10/25/24 1512  Chief Complaint(s) Code Stroke  HPI Scott Tate is a 68 y.o. male history of hypertension presenting to the emergency department with headache.  Patient reports headache on the left side which has been present for a few weeks intermittently.  No vision changes, trouble eating, jaw claudication, facial droop, weakness.  Reports some right leg tingling which has been present for some time.  Possibly had episode of right leg weakness however this is resolved.  No chest pain or back pain.  EMS evaluated patient and activated him as a code stroke.   Past Medical History Past Medical History:  Diagnosis Date   Hemorrhoid    Hypertension    There are no active problems to display for this patient.  Home Medication(s) Prior to Admission medications  Medication Sig Start Date End Date Taking? Authorizing Provider  albuterol  (VENTOLIN  HFA) 108 (90 Base) MCG/ACT inhaler Inhale 2 puffs into the lungs every 6 (six) hours as needed for wheezing or shortness of breath.    [provider]  benzonatate  (TESSALON ) 100 MG capsule Take 1 capsule (100 mg total) by mouth every 8 (eight) hours. 07/16/23   Kandis Perkins, DO  cephALEXin  (KEFLEX ) 500 MG capsule Take 1 capsule (500 mg total) by mouth 4 (four) times daily. 04/16/24   Haze Lonni PARAS, MD  cyclobenzaprine  (FLEXERIL ) 10 MG tablet Take 1 tablet (10 mg total) by mouth 3 (three) times daily as needed for muscle spasms. 06/13/22   Geroldine Berg, MD  docusate sodium  (COLACE) 250 MG capsule Take 1 capsule (250 mg total) by mouth daily. 02/23/21   Charlyn Sora, MD  famotidine  (PEPCID ) 20 MG tablet Take 1 tablet (20 mg total) by mouth at bedtime. 09/22/23   Raspet, Erin K, PA-C  guaiFENesin  (ROBITUSSIN) 100 MG/5ML liquid Take 5 mLs by mouth every 6 (six) hours as needed for cough or to loosen phlegm. 07/16/23   Kandis Perkins, DO  lidocaine  (XYLOCAINE ) 2 % jelly Apply 1 application topically 3 (three) times daily as needed. Max use for 7 days 02/23/21   Charlyn Sora, MD  Lidocaine , Anorectal, 5 % CREA Apply 1 application topically daily as needed (pain).    [provider]  losartan (COZAAR) 25 MG tablet Take 25 mg by mouth daily. 09/27/20   [provider]  nicotine  (NICODERM CQ  - DOSED IN MG/24 HOURS) 14 mg/24hr patch Place 1 patch (14 mg total) onto the skin daily. 07/10/24   Hoy Nidia FALCON, PA-C  omeprazole  (PRILOSEC) 20 MG capsule Take 20 mg by mouth daily. 02/08/21   [provider]  Pramox-PE-Glycerin-Petrolatum (PREPARATION H) 1-0.25-14.4-15 % CREA Apply 1 application topically 3 (three) times daily.    [provider]  predniSONE  (DELTASONE ) 20 MG tablet Take 2 tablets (40 mg total) by mouth daily. 07/10/24   Hoy Nidia FALCON, PA-C  sucralfate  (CARAFATE ) 1 g tablet Take 1 tablet (1 g total) by mouth 4 (four) times daily -  with meals and at bedtime. 09/22/23   Raspet, Erin K, PA-C  tamsulosin  (FLOMAX ) 0.4 MG CAPS capsule Take 0.4 mg by mouth daily. 07/14/20   [provider]  traMADol  (ULTRAM ) 50 MG tablet Take 50 mg by mouth every 6 (six) hours as needed for severe pain (pain). 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  Review of Systems Review of Systems  All other systems reviewed and are negative.   Physical Exam Vital Signs  I  have reviewed the triage vital signs BP 115/61   Pulse (!) 55   Temp 97.8 F (36.6 C) (Oral)   Resp 15   Ht 5' 5 (1.651 m)   Wt 69.6 kg   SpO2 98%   BMI 25.53 kg/m  Physical Exam Vitals and nursing note reviewed.  Constitutional:      General: He is not in acute distress.    Appearance: Normal appearance.  HENT:     Head: Normocephalic and atraumatic.     Comments: Localizes pain to the left temporal region however no temporal artery tenderness    Mouth/Throat:     Mouth: Mucous membranes are moist.  Eyes:     Conjunctiva/sclera: Conjunctivae normal.  Cardiovascular:     Rate and Rhythm: Normal rate and regular rhythm.  Pulmonary:     Effort: Pulmonary effort is normal. No respiratory distress.     Breath sounds: Normal breath sounds.  Abdominal:     General: Abdomen is flat.     Palpations: Abdomen is soft.     Tenderness: There is no abdominal tenderness.  Musculoskeletal:     Right lower leg: No edema.     Left lower leg: No edema.  Skin:    General: Skin is warm and dry.     Capillary Refill: Capillary refill takes less than 2 seconds.  Neurological:     Mental Status: He is alert and oriented to person, place, and time. Mental status is at baseline.     Comments: Cranial nerves II through XII intact, strength 5 out of 5 in the bilateral upper and lower extremities, no sensory deficit to light touch, no dysmetria on finger-nose-finger testing, ambulatory with steady gait.  Psychiatric:        Mood and Affect: Mood normal.        Behavior: Behavior normal.     ED Results and Treatments Labs (all labs ordered are listed, but only abnormal results are displayed) Labs Reviewed  CBC - Abnormal; Notable for the following components:      Result Value   WBC 12.3 (*)    All other components within normal limits  DIFFERENTIAL - Abnormal; Notable for the following components:   Neutro Abs 10.3 (*)    All other components within normal limits  I-STAT CHEM 8, ED -  Abnormal; Notable for the following components:   Creatinine, Ser 1.30 (*)    All other components within normal limits  PROTIME-INR  APTT  COMPREHENSIVE METABOLIC PANEL WITH GFR  ETHANOL  C-REACTIVE PROTEIN  SEDIMENTATION RATE  CBG MONITORING, ED  Radiology CT ANGIO HEAD NECK W WO CM (CODE STROKE) Result Date: 10/25/2024 EXAM: CTA Head and Neck with Intravenous Contrast. CLINICAL HISTORY: Neuro deficit, acute, stroke suspected. TECHNIQUE: Axial CTA images of the head and neck performed with intravenous contrast. MIP reconstructed images were created and reviewed. Axial computed tomography images of the head/brain performed without intravenous contrast. Note: Per PQRS, the description of internal carotid artery percent stenosis, including 0 percent or normal exam, is based on [employer] American Symptomatic Carotid Endarterectomy Trial (NASCET) criteria. Dose reduction technique was used including one or more of the following: automated exposure control, adjustment of mA and kV according to patient size, and/or iterative reconstruction. CONTRAST: Without and with; 75 mL (iohexol  (OMNIPAQUE ) 350 MG/ML injection 75 mL IOHEXOL  350 MG/ML SOLN). COMPARISON: None provided. FINDINGS: CTA NECK: The aortic arch is not visualized. There is possible common origin of the brachiocephalic and left common carotid arteries. COMMON CAROTID ARTERIES: No significant stenosis. No dissection or occlusion. INTERNAL CAROTID ARTERIES: No stenosis by NASCET criteria. No dissection or occlusion. VERTEBRAL ARTERIES: No significant stenosis. No dissection or occlusion. CTA HEAD: ANTERIOR CEREBRAL ARTERIES: No significant stenosis. No occlusion. No aneurysm. MIDDLE CEREBRAL ARTERIES: No significant stenosis. No occlusion. No aneurysm. POSTERIOR CEREBRAL ARTERIES: No significant stenosis. No occlusion. No aneurysm.  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 No significant stenosis. No occlusion. No aneurysm. OTHER: SOFT TISSUES: No acute finding. No masses or lymphadenopathy. BONES: Degenerative changes of the visualized spine. Degenerative changes of the left temporomandibular joint. No acute osseous abnormality. LUNGS: Centrilobular emphysema in the lung apices. IMPRESSION: 1. No large vessel occlusion. 2. No evidence of significant stenosis, aneurysmal dilatation, or dissection involving the arteries of the head and neck. 3. Pulmonary emphysema is an independent risk factor for lung cancer. Recommend consideration for evaluation for a low-dose CT lung cancer screening program. Electronically signed by: Donnice Mania MD 10/25/2024 03:43 PM EST RP Workstation: [id number]   CT HEAD CODE STROKE WO CONTRAST Result Date: 10/25/2024 EXAM: CT HEAD WITHOUT CONTRAST 10/25/2024 03:17:44 PM TECHNIQUE: CT of the head was performed without the administration of intravenous contrast. Automated exposure control, iterative reconstruction, and/or weight based adjustment of the mA/kV was utilized to reduce the radiation dose to as low as reasonably achievable. COMPARISON: CT head 07/21/2024. CLINICAL HISTORY: Neuro deficit, acute, stroke suspected. FINDINGS: BRAIN AND VENTRICLES: No acute hemorrhage. No evidence of acute infarct. No hydrocephalus. No extra-axial collection. No mass effect or midline shift. Similar bilateral basal ganglia mineralization. Additional focal chronic calcification in the anterior right corona radiata. [place] Stroke Program Early CT (ASPECT) score: Ganglionic (caudate, IC, lentiform nucleus, insula, M1-M3): 7. Supraganglionic (M4-M6): 3. Total: 10. ORBITS: No acute abnormality. SINUSES: No acute abnormality. SOFT TISSUES AND SKULL: No acute soft tissue abnormality. No skull fracture. IMPRESSION: 1. No acute intracranial abnormality. 2. ASPECT score 10. 3. Findings messaged to Dr.  Rosemarie via the [facility] messaging system at 3:23 PM on 10/25/24. Electronically signed by: Donnice Mania MD 10/25/2024 03:24 PM EST RP Workstation: [id number]    Pertinent labs & imaging results that were available during my care of the patient were reviewed by me and considered in my medical decision making (see MDM for details).  Medications Ordered in ED Medications  sodium chloride  flush (NS) 0.9 % injection 3 mL (3 mLs Intravenous Given 10/25/24 1605)  iohexol  (OMNIPAQUE ) 350 MG/ML injection 75 mL (75 mLs Intravenous Contrast Given 10/25/24 1521)  acetaminophen  (TYLENOL ) tablet 1,000 mg (1,000 mg Oral Given 10/25/24 1653)  prochlorperazine  (  COMPAZINE ) injection 10 mg (10 mg Intravenous Given 10/25/24 1655)  diphenhydrAMINE  (BENADRYL ) injection 12.5 mg (12.5 mg Intravenous Given 10/25/24 1700)                                                                                                                                     Procedures Procedures  (including critical care time)  Medical Decision Making / ED Course   MDM:  68 year old presenting to the emergency department with headache.  Patient is overall well-appearing, physical examination with no focal deficit appreciable.  He was seen by neurology initially as a code stroke and this was canceled as neurology felt symptoms were not compatible with stroke.  Recommended headache treatment  Patient received migraine cocktail and reports symptoms have resolved.  He was able to get up and ambulate without difficulty.  Had some bradycardia noted which is sinus bradycardia on the monitor.  Patient demonstrates normal chronotropic response with ambulation.  Considered other process such as temporal arteritis given localization of symptoms to the left temporal region however ESR and CRP are normal.  Given age did recommend outpatient follow-up with neurology and [place] placed referral for this.  Low concern for other dangerous cause of headache such  as carbon oxide poisoning or CNS infection.  Will discharge patient to home. All questions answered. Patient comfortable with plan of discharge. Return precautions discussed with patient and specified on the after visit summary.       Additional history obtained: -Additional history obtained from ems -External records from outside source obtained and reviewed including: Chart review including previous notes, labs, imaging, consultation notes including prior notes    Lab Tests: -I ordered, reviewed, and interpreted labs.   The pertinent results include:   Labs Reviewed  CBC - Abnormal; Notable for the following components:      Result Value   WBC 12.3 (*)    All other components within normal limits  DIFFERENTIAL - Abnormal; Notable for the following components:   Neutro Abs 10.3 (*)    All other components within normal limits  I-STAT CHEM 8, ED - Abnormal; Notable for the following components:   Creatinine, Ser 1.30 (*)    All other components within normal limits  PROTIME-INR  APTT  COMPREHENSIVE METABOLIC PANEL WITH GFR  ETHANOL  C-REACTIVE PROTEIN  SEDIMENTATION RATE  CBG MONITORING, ED    Notable for mild leukocytosis   EKG   EKG Interpretation Date/Time:  Monday October 25 2024 18:24:00 EST Ventricular Rate:  58 PR Interval:  149 QRS Duration:  107 QT Interval:  465 QTC Calculation: 457 R Axis:   81  Text Interpretation: Sinus rhythm Borderline right axis deviation Confirmed by Francesca Fallow ([id number]) on 10/25/2024 7:47:39 PM         Imaging Studies ordered: I ordered imaging studies including CT, CTA head neck On my interpretation imaging demonstrates no acute process I independently visualized and interpreted  imaging. I agree with the radiologist interpretation   Medicines ordered and prescription drug management: Meds ordered this encounter  Medications   sodium chloride  flush (NS) 0.9 % injection 3 mL   iohexol  (OMNIPAQUE ) 350 MG/ML  injection 75 mL   acetaminophen  (TYLENOL ) tablet 1,000 mg   prochlorperazine  (COMPAZINE ) injection 10 mg   diphenhydrAMINE  (BENADRYL ) injection 12.5 mg    -I have reviewed the patients home medicines and have made adjustments as needed   Consultations Obtained: I requested consultation with the neurologist,  and discussed lab and imaging findings as well as pertinent plan - they recommend: headache treatment    Reevaluation: After the interventions noted above, I reevaluated the patient and found that their symptoms have resolved  Co morbidities that complicate the patient evaluation  Past Medical History:  Diagnosis Date   Hemorrhoid    Hypertension       Dispostion: Disposition decision including need for hospitalization was considered, and patient discharged from emergency department.    Final Clinical Impression(s) / ED Diagnoses Final diagnoses:  Nonintractable headache, unspecified chronicity pattern, unspecified headache type     This chart was dictated using voice recognition software.  Despite best efforts to proofread,  errors can occur which can change the documentation meaning.     [1]  Social History Tobacco Use   Smoking status: Every Day    Current packs/day: 0.25    Types: Cigarettes   Smokeless tobacco: Never  Vaping Use   Vaping status: Never Used  Substance Use Topics   Alcohol use: Yes    Alcohol/week: 8.0 standard drinks of alcohol    Types: 8 Cans of beer per week    Comment: occ   Drug use: Not Currently    Types: Marijuana, Cocaine    Comment: last use 12/2014     Francesca Elsie CROME, MD 10/25/24 2250  "

## 2024-10-25 NOTE — Discharge Instructions (Addendum)
 We evaluated you for your headache.  You were seen by the neurologist.  Your testing in the emergency department was reassuring and your headache improved with treatment.  Please take Tylenol  (acetaminophen ) and Motrin  (ibuprofen ) for your symptoms at home.  You can take 1000 mg of Tylenol  every 6 hours and 600 mg of Motrin  every 6 hours as needed for your symptoms.  You can take these medicines together as needed, either at the same time, or alternating every 3 hours.  We feel that it is safe to go home at this time.  We would like you to follow-up with a neurologist.  We have placed a neurology referral.  If you have any new or worsening symptoms such as weakness in your arms or legs, trouble speaking, trouble walking, vision changes, or any other new symptoms please return to the emergency department.

## 2024-10-25 NOTE — ED Notes (Signed)
 CCMD Called

## 2024-10-25 NOTE — Code Documentation (Signed)
 Stroke Response Nurse Documentation Code Documentation  Scott Tate is a 68 y.o. male arriving to [name]  via [facility] on 10-25-2024 with past medical hx of Chronic HA, HTN. On No antithrombotic. Code stroke was activated by EMS.   Patient from home where he was LKW at 0200 prior to going to bed and now complaining of headache.  He describes an episode of right side weakness and numbness about 2pm when his headache was sever  Stroke team at the bedside on patient arrival. Labs drawn and patient cleared for CT by Dr. Francesca. Patient to CT with team. NIHSS 1, see documentation for details and code stroke times. Patient with right decreased sensation of right leg on exam.  He states that this is chronic.  He does still endorse a mild to moderate headache. The following imaging was completed:  CT Head and CTA. Patient is not a candidate for IV Thrombolytic due to symptoms resolved. Patient is not a candidate for IR due to no LVO on CTA.   Care Plan: NIHSS q 2 hours x 12 hours.   Bedside handoff with ED RN Brittany.    Scott Tate  Stroke Response RN

## 2024-10-25 NOTE — ED Triage Notes (Signed)
 PT BIB [facility] d/t stroke like symptoms. EMS reports pt wife was following him while driving and pt pulled over was c/o dizziness, left sided headache and right sided weakness.Per EMS pt has a lot of family related stress.  BP 150/76 HR 60 CBG 180 98% RA 18G L-AC

## 2025-01-14 ENCOUNTER — Ambulatory Visit: Admitting: Neurology
